# Patient Record
Sex: Male | Born: 1940 | Race: White | Hispanic: No | Marital: Married | Smoking: Former smoker
Health system: Southern US, Community
[De-identification: ages and names within clinical notes are randomized; demographics above are authoritative.]

## PROBLEM LIST (undated history)

## (undated) DIAGNOSIS — Z950 Presence of cardiac pacemaker: Secondary | ICD-10-CM

## (undated) DIAGNOSIS — I4891 Unspecified atrial fibrillation: Secondary | ICD-10-CM

## (undated) DIAGNOSIS — Z8719 Personal history of other diseases of the digestive system: Secondary | ICD-10-CM

## (undated) DIAGNOSIS — F0390 Unspecified dementia without behavioral disturbance: Secondary | ICD-10-CM

## (undated) DIAGNOSIS — I10 Essential (primary) hypertension: Secondary | ICD-10-CM

## (undated) DIAGNOSIS — C4490 Unspecified malignant neoplasm of skin, unspecified: Secondary | ICD-10-CM

## (undated) DIAGNOSIS — C61 Malignant neoplasm of prostate: Secondary | ICD-10-CM

## (undated) DIAGNOSIS — Z95810 Presence of automatic (implantable) cardiac defibrillator: Secondary | ICD-10-CM

## (undated) DIAGNOSIS — J189 Pneumonia, unspecified organism: Secondary | ICD-10-CM

## (undated) DIAGNOSIS — M1990 Unspecified osteoarthritis, unspecified site: Secondary | ICD-10-CM

## (undated) DIAGNOSIS — Z8711 Personal history of peptic ulcer disease: Secondary | ICD-10-CM

## (undated) DIAGNOSIS — I495 Sick sinus syndrome: Secondary | ICD-10-CM

## (undated) DIAGNOSIS — E785 Hyperlipidemia, unspecified: Secondary | ICD-10-CM

## (undated) HISTORY — PX: TRANSURETHRAL RESECTION OF PROSTATE: SHX73

## (undated) HISTORY — PX: LOOP RECORDER IMPLANT: SHX5954

## (undated) HISTORY — PX: EYE SURGERY: SHX253

## (undated) HISTORY — PX: FRACTURE SURGERY: SHX138

## (undated) HISTORY — PX: LOOP RECORDER REMOVAL: EP1215

## (undated) HISTORY — PX: HERNIA REPAIR: SHX51

## (undated) HISTORY — PX: CATARACT EXTRACTION, BILATERAL: SHX1313

## (undated) HISTORY — PX: RETINAL DETACHMENT SURGERY: SHX105

## (undated) HISTORY — PX: WRIST FRACTURE SURGERY: SHX121

## (undated) HISTORY — PX: INSERT / REPLACE / REMOVE PACEMAKER: SUR710

---

## 2018-02-27 ENCOUNTER — Inpatient Hospital Stay (HOSPITAL_COMMUNITY)
Admission: EM | Admit: 2018-02-27 | Discharge: 2018-03-01 | DRG: 193 | Disposition: A | Discharge status: Nursing Home (Includes ICF) | Payer: Medicare Other | Attending: Internal Medicine | Admitting: Internal Medicine

## 2018-02-27 ENCOUNTER — Emergency Department (HOSPITAL_COMMUNITY): Payer: Medicare Other

## 2018-02-27 ENCOUNTER — Encounter (HOSPITAL_COMMUNITY): Payer: Self-pay | Admitting: Emergency Medicine

## 2018-02-27 ENCOUNTER — Inpatient Hospital Stay (HOSPITAL_COMMUNITY): Payer: Medicare Other

## 2018-02-27 ENCOUNTER — Other Ambulatory Visit: Payer: Self-pay

## 2018-02-27 DIAGNOSIS — E876 Hypokalemia: Secondary | ICD-10-CM | POA: Diagnosis present

## 2018-02-27 DIAGNOSIS — I495 Sick sinus syndrome: Secondary | ICD-10-CM | POA: Diagnosis present

## 2018-02-27 DIAGNOSIS — I480 Paroxysmal atrial fibrillation: Secondary | ICD-10-CM | POA: Diagnosis not present

## 2018-02-27 DIAGNOSIS — G9341 Metabolic encephalopathy: Secondary | ICD-10-CM | POA: Diagnosis present

## 2018-02-27 DIAGNOSIS — Y95 Nosocomial condition: Secondary | ICD-10-CM | POA: Diagnosis present

## 2018-02-27 DIAGNOSIS — R0603 Acute respiratory distress: Secondary | ICD-10-CM | POA: Diagnosis present

## 2018-02-27 DIAGNOSIS — F0390 Unspecified dementia without behavioral disturbance: Secondary | ICD-10-CM | POA: Diagnosis present

## 2018-02-27 DIAGNOSIS — Z66 Do not resuscitate: Secondary | ICD-10-CM | POA: Diagnosis present

## 2018-02-27 DIAGNOSIS — E039 Hypothyroidism, unspecified: Secondary | ICD-10-CM | POA: Diagnosis present

## 2018-02-27 DIAGNOSIS — J181 Lobar pneumonia, unspecified organism: Secondary | ICD-10-CM | POA: Diagnosis present

## 2018-02-27 DIAGNOSIS — I10 Essential (primary) hypertension: Secondary | ICD-10-CM | POA: Diagnosis present

## 2018-02-27 DIAGNOSIS — E785 Hyperlipidemia, unspecified: Secondary | ICD-10-CM | POA: Diagnosis present

## 2018-02-27 DIAGNOSIS — Z87891 Personal history of nicotine dependence: Secondary | ICD-10-CM

## 2018-02-27 DIAGNOSIS — F0281 Dementia in other diseases classified elsewhere with behavioral disturbance: Secondary | ICD-10-CM | POA: Diagnosis not present

## 2018-02-27 DIAGNOSIS — Z950 Presence of cardiac pacemaker: Secondary | ICD-10-CM

## 2018-02-27 DIAGNOSIS — G309 Alzheimer's disease, unspecified: Secondary | ICD-10-CM | POA: Diagnosis not present

## 2018-02-27 DIAGNOSIS — J189 Pneumonia, unspecified organism: Secondary | ICD-10-CM | POA: Diagnosis present

## 2018-02-27 HISTORY — DX: Presence of automatic (implantable) cardiac defibrillator: Z95.810

## 2018-02-27 HISTORY — DX: Personal history of other diseases of the digestive system: Z87.19

## 2018-02-27 HISTORY — DX: Unspecified malignant neoplasm of skin, unspecified: C44.90

## 2018-02-27 HISTORY — DX: Unspecified dementia, unspecified severity, without behavioral disturbance, psychotic disturbance, mood disturbance, and anxiety: F03.90

## 2018-02-27 HISTORY — DX: Personal history of peptic ulcer disease: Z87.11

## 2018-02-27 HISTORY — DX: Unspecified atrial fibrillation: I48.91

## 2018-02-27 HISTORY — DX: Essential (primary) hypertension: I10

## 2018-02-27 HISTORY — DX: Malignant neoplasm of prostate: C61

## 2018-02-27 HISTORY — DX: Pneumonia, unspecified organism: J18.9

## 2018-02-27 HISTORY — DX: Sick sinus syndrome: I49.5

## 2018-02-27 HISTORY — DX: Hyperlipidemia, unspecified: E78.5

## 2018-02-27 HISTORY — DX: Presence of cardiac pacemaker: Z95.0

## 2018-02-27 HISTORY — DX: Unspecified osteoarthritis, unspecified site: M19.90

## 2018-02-27 LAB — PROTIME-INR
INR: 1.17
Prothrombin Time: 14.8 seconds (ref 11.4–15.2)

## 2018-02-27 LAB — URINALYSIS, ROUTINE W REFLEX MICROSCOPIC
Bilirubin Urine: NEGATIVE
Glucose, UA: NEGATIVE mg/dL
Hgb urine dipstick: NEGATIVE
Ketones, ur: NEGATIVE mg/dL
LEUKOCYTES UA: NEGATIVE
NITRITE: NEGATIVE
PH: 7 (ref 5.0–8.0)
Protein, ur: NEGATIVE mg/dL
SPECIFIC GRAVITY, URINE: 1.017 (ref 1.005–1.030)

## 2018-02-27 LAB — INFLUENZA PANEL BY PCR (TYPE A & B)
Influenza A By PCR: NEGATIVE
Influenza B By PCR: NEGATIVE

## 2018-02-27 LAB — I-STAT TROPONIN, ED: TROPONIN I, POC: 0 ng/mL (ref 0.00–0.08)

## 2018-02-27 LAB — CBC WITH DIFFERENTIAL/PLATELET
BASOS PCT: 0 %
Basophils Absolute: 0 10*3/uL (ref 0.0–0.1)
EOS ABS: 0 10*3/uL (ref 0.0–0.7)
EOS PCT: 0 %
HCT: 36.4 % — ABNORMAL LOW (ref 39.0–52.0)
Hemoglobin: 12.5 g/dL — ABNORMAL LOW (ref 13.0–17.0)
LYMPHS ABS: 0.7 10*3/uL (ref 0.7–4.0)
Lymphocytes Relative: 7 %
MCH: 30.1 pg (ref 26.0–34.0)
MCHC: 34.3 g/dL (ref 30.0–36.0)
MCV: 87.7 fL (ref 78.0–100.0)
Monocytes Absolute: 0.6 10*3/uL (ref 0.1–1.0)
Monocytes Relative: 6 %
Neutro Abs: 9.4 10*3/uL — ABNORMAL HIGH (ref 1.7–7.7)
Neutrophils Relative %: 87 %
PLATELETS: 204 10*3/uL (ref 150–400)
RBC: 4.15 MIL/uL — AB (ref 4.22–5.81)
RDW: 12.9 % (ref 11.5–15.5)
WBC: 10.8 10*3/uL — AB (ref 4.0–10.5)

## 2018-02-27 LAB — COMPREHENSIVE METABOLIC PANEL
ALBUMIN: 3.4 g/dL — AB (ref 3.5–5.0)
ALT: 16 U/L — ABNORMAL LOW (ref 17–63)
ANION GAP: 13 (ref 5–15)
AST: 29 U/L (ref 15–41)
Alkaline Phosphatase: 62 U/L (ref 38–126)
BUN: 18 mg/dL (ref 6–20)
CHLORIDE: 100 mmol/L — AB (ref 101–111)
CO2: 23 mmol/L (ref 22–32)
Calcium: 9 mg/dL (ref 8.9–10.3)
Creatinine, Ser: 1.03 mg/dL (ref 0.61–1.24)
GFR calc non Af Amer: >60 mL/min (ref >60)
GLUCOSE: 130 mg/dL — AB (ref 65–99)
Potassium: 3.4 mmol/L — ABNORMAL LOW (ref 3.5–5.1)
SODIUM: 136 mmol/L (ref 135–145)
Total Bilirubin: 0.6 mg/dL (ref 0.3–1.2)
Total Protein: 6.3 g/dL — ABNORMAL LOW (ref 6.5–8.1)

## 2018-02-27 LAB — HIV ANTIBODY (ROUTINE TESTING W REFLEX): HIV SCREEN 4TH GENERATION: NONREACTIVE

## 2018-02-27 LAB — I-STAT CG4 LACTIC ACID, ED
LACTIC ACID, VENOUS: 1.69 mmol/L (ref 0.5–1.9)
LACTIC ACID, VENOUS: 0.86 mmol/L (ref 0.5–1.9)

## 2018-02-27 LAB — BRAIN NATRIURETIC PEPTIDE: B Natriuretic Peptide: 37.3 pg/mL (ref 0.0–100.0)

## 2018-02-27 LAB — STREP PNEUMONIAE URINARY ANTIGEN: STREP PNEUMO URINARY ANTIGEN: NEGATIVE

## 2018-02-27 MED ORDER — SODIUM CHLORIDE 0.9 % IV BOLUS (SEPSIS)
500.0000 mL | Freq: Once | INTRAVENOUS | Status: AC
  Administered 2018-02-27: 500 mL via INTRAVENOUS

## 2018-02-27 MED ORDER — PIPERACILLIN-TAZOBACTAM 3.375 G IVPB 30 MIN
3.3750 g | Freq: Once | INTRAVENOUS | Status: AC
  Administered 2018-02-27: 3.375 g via INTRAVENOUS
  Filled 2018-02-27: qty 50

## 2018-02-27 MED ORDER — VANCOMYCIN HCL IN DEXTROSE 750-5 MG/150ML-% IV SOLN
750.0000 mg | Freq: Two times a day (BID) | INTRAVENOUS | Status: DC
  Administered 2018-02-27 – 2018-02-28 (×2): 750 mg via INTRAVENOUS
  Filled 2018-02-27 (×4): qty 150

## 2018-02-27 MED ORDER — RISPERIDONE 2 MG PO TABS
2.0000 mg | ORAL_TABLET | Freq: Two times a day (BID) | ORAL | Status: DC
  Administered 2018-02-27 – 2018-03-01 (×4): 2 mg via ORAL
  Filled 2018-02-27 (×6): qty 1

## 2018-02-27 MED ORDER — APIXABAN 5 MG PO TABS
5.0000 mg | ORAL_TABLET | Freq: Two times a day (BID) | ORAL | Status: DC
  Administered 2018-02-27 – 2018-03-01 (×5): 5 mg via ORAL
  Filled 2018-02-27 (×8): qty 1

## 2018-02-27 MED ORDER — SODIUM CHLORIDE 0.9 % IV BOLUS (SEPSIS)
1000.0000 mL | Freq: Once | INTRAVENOUS | Status: AC
  Administered 2018-02-27: 1000 mL via INTRAVENOUS

## 2018-02-27 MED ORDER — ACETAMINOPHEN 650 MG RE SUPP
650.0000 mg | Freq: Once | RECTAL | Status: AC
  Administered 2018-02-27: 650 mg via RECTAL
  Filled 2018-02-27: qty 1

## 2018-02-27 MED ORDER — SODIUM CHLORIDE 0.9 % IV SOLN
1.0000 g | Freq: Three times a day (TID) | INTRAVENOUS | Status: DC
  Administered 2018-02-27 – 2018-02-28 (×4): 1 g via INTRAVENOUS
  Filled 2018-02-27 (×7): qty 1

## 2018-02-27 MED ORDER — LEVOTHYROXINE SODIUM 100 MCG IV SOLR
22.0000 ug | Freq: Every day | INTRAVENOUS | Status: DC
  Administered 2018-02-28: 22 ug via INTRAVENOUS
  Filled 2018-02-27 (×2): qty 5

## 2018-02-27 MED ORDER — ACETAMINOPHEN 325 MG PO TABS: 650.0000 mg | ORAL_TABLET | Freq: Four times a day (QID) | ORAL | Status: DC | PRN

## 2018-02-27 MED ORDER — VANCOMYCIN HCL 10 G IV SOLR
1500.0000 mg | Freq: Once | INTRAVENOUS | Status: AC
  Administered 2018-02-27: 1500 mg via INTRAVENOUS
  Filled 2018-02-27: qty 1500

## 2018-02-27 MED ORDER — ACETAMINOPHEN 650 MG RE SUPP: 650.0000 mg | Freq: Four times a day (QID) | RECTAL | Status: DC | PRN

## 2018-02-27 MED ORDER — LEVOTHYROXINE SODIUM 100 MCG IV SOLR
22.0000 ug | Freq: Every day | INTRAVENOUS | Status: DC
  Filled 2018-02-27: qty 5

## 2018-02-27 MED ORDER — IPRATROPIUM-ALBUTEROL 0.5-2.5 (3) MG/3ML IN SOLN
3.0000 mL | Freq: Once | RESPIRATORY_TRACT | Status: AC
  Administered 2018-02-27: 3 mL via RESPIRATORY_TRACT
  Filled 2018-02-27: qty 3

## 2018-02-27 MED ORDER — VANCOMYCIN HCL IN DEXTROSE 1-5 GM/200ML-% IV SOLN: 1000.0000 mg | Freq: Once | INTRAVENOUS | Status: DC

## 2018-02-27 MED ORDER — SODIUM CHLORIDE 0.9 % IV SOLN
INTRAVENOUS | Status: DC
  Administered 2018-02-27 – 2018-02-28 (×5): via INTRAVENOUS

## 2018-02-27 NOTE — Progress Notes (Signed)
Pharmacy Antibiotic Note  Zachary Vasquez is a 77 y.o. male admitted on 02/27/2018 with pneumonia.  Pharmacy has been consulted for Vancomycin/Cefepime dosing. From nursing facility with fever, confusion, and lethargy. WBC 10.8. Renal function OK.   Plan: Vancomycin 1500 mg IV x 1 load already given in the ED, then give 750 mg IV q12h Cefepime 1g IV q8h Trend WBC, temp, renal function  F/U infectious work-up Drug levels as indicated   Height: 6' (182.9 cm) Weight: 180 lb (81.6 kg) IBW/kg (Calculated) : 77.6  Temp (24hrs), Avg:104.4 F (40.2 C), Min:104.4 F (40.2 C), Max:104.4 F (40.2 C)  Recent Labs  Lab 02/27/18 0129 02/27/18 0144  WBC 10.8*  --   CREATININE 1.03  --   LATICACIDVEN  --  1.69    Estimated Creatinine Clearance: 68 mL/min (by C-G formula based on SCr of 1.03 mg/dL).    Allergies no known allergies   Zachary Vasquez 02/27/2018 2:59 AM

## 2018-02-27 NOTE — ED Notes (Signed)
Pharmacy notified to adjust meds as they were not available.

## 2018-02-27 NOTE — ED Notes (Addendum)
Ordered pt's lunch tray ([employer]).

## 2018-02-27 NOTE — Progress Notes (Addendum)
Patient was admitted this morning and was seen by Dr. Alcario Drought. Patient is a 77 year old male with past medical history of paroxysmal atrial fibrillation, status post pacemaker placement, advanced dementia, nursing home resident who presents to the emergency department with 3-4-day history of fever and lethargy.  Patient progressively became more lethargic and confused at nursing home. He was found to have fever on presentation .Chest x-ray showed right lower lobe pneumonia and a small right pleural effusion. Patient was admitted for the management of healthcare associated pneumonia.  Started on broad-spectrum antibiotic with cefepime and vancomycin.  Cultures were sent.  Started on antibiotics. Patient seen and examined at the bedside .Wife was on the bedside.  Currently he looked comfortable.  He was not in any respiratory distress.  He was sleeping. He is hemodynamically stable. We will continue to monitor the patient.

## 2018-02-27 NOTE — ED Notes (Signed)
Pharmacy sent message about meds

## 2018-02-27 NOTE — ED Notes (Signed)
Code sepsis activated RN Mortimer Fries aware

## 2018-02-27 NOTE — ED Notes (Signed)
Pt's lunch tray arrived. 

## 2018-02-27 NOTE — ED Notes (Signed)
Patient placed on a monitor and pulse oximetry , NS IV bolus and Zosyn IV infusing , IV sites intact , Blood cultures collected prior to antibiotic infusion . Family at bedside , respirations unlabored .

## 2018-02-27 NOTE — ED Notes (Signed)
Report attempted x 1

## 2018-02-27 NOTE — ED Notes (Signed)
Visitor offered food, denies at this time.

## 2018-02-27 NOTE — H&P (Signed)
History and Physical    Zachary Vasquez MGN:[id number] DOB: May 22, 1941 DOA: 02/27/2018  PCP: [name], MD  Patient coming from: [facility] have personally briefly reviewed patient's old medical records in [facility]  Chief Complaint: Fever  HPI: Zachary Vasquez is a 77 y.o. male with medical history significant of PAF, SSS s/p PPM, dementia in SNF.  Patient presents to the ED with 3-4 day h/o fever and lethargy.  Family reports slowly declining mental status over that time period.  Doesn't talk much at baseline but has been even less responsive over past couple of days.  Has productive cough, no known injuries or falls, no vomiting nor diarrhea.   ED Course: Tm 104.4 rectally on presentation.  BP 118/50, HR 87 in NSR.  Satting 89% on RA, put on [place].  WBC 10.8k.  CXR demonstrates RLL PNA and small R pleural effusion.   Review of Systems: As per HPI otherwise 10 point review of systems negative.   Past Medical History:  Diagnosis Date  . A-fib ([place])   . Dementia   . HLD (hyperlipidemia)   . HTN (hypertension)   . SSS (sick sinus syndrome) ([facility])    s/p PPM    Past Surgical History:  Procedure Laterality Date  . HERNIA REPAIR    . PACEMAKER IMPLANT    . RETINAL DETACHMENT SURGERY       reports that he has quit smoking. He does not have any smokeless tobacco history on file. He reports that he drinks alcohol. He reports that he does not use drugs.  Allergies no known allergies  Family History  Problem Relation Age of Onset  . Alzheimer's disease Mother   . Lung cancer Father      Prior to Admission medications   Not on File    Physical Exam: Vitals:   02/27/18 0145 02/27/18 0200 02/27/18 0215 02/27/18 0230  BP: (!) 141/72 107/81 (!) 159/60 (!) 163/84  Pulse: (!) 116 (!) 121 (!) 59 (!) 114  Resp: 16 18 18 15   Temp:      TempSrc:      SpO2: 93% 95% 96% 97%  Weight:      Height:        Constitutional: NAD, calm, comfortable Eyes:  PERRL, lids and conjunctivae normal ENMT: Mucous membranes are moist. Posterior pharynx clear of any exudate or lesions.Normal dentition.  Neck: normal, supple, no masses, no thyromegaly Respiratory: Rales at base Cardiovascular: Regular rate and rhythm, no murmurs / rubs / gallops. No extremity edema. 2+ pedal pulses. No carotid bruits.  Abdomen: no tenderness, no masses palpated. No hepatosplenomegaly. Bowel sounds positive.  Musculoskeletal: no clubbing / cyanosis. No joint deformity upper and lower extremities. Good ROM, no contractures. Normal muscle tone.  Skin: no rashes, lesions, ulcers. No induration Neurologic: MAE, spontaneous eye opening, not answering questions nor following commands, intermittent tremor of L shoulder.  Definitely responsive to noxious stimuli so dont think patient in status epilepticus.   Labs on Admission: I have personally reviewed following labs and imaging studies  CBC: Recent Labs  Lab 02/27/18 0129  WBC 10.8*  NEUTROABS 9.4*  HGB 12.5*  HCT 36.4*  MCV 87.7  PLT 916   Basic Metabolic Panel: Recent Labs  Lab 02/27/18 0129  NA 136  K 3.4*  CL 100*  CO2 23  GLUCOSE 130*  BUN 18  CREATININE 1.03  CALCIUM 9.0   GFR: Estimated Creatinine Clearance: 68 mL/min (by C-G formula based on SCr  of 1.03 mg/dL). Liver Function Tests: Recent Labs  Lab 02/27/18 0129  AST 29  ALT 16*  ALKPHOS 62  BILITOT 0.6  PROT 6.3*  ALBUMIN 3.4*   No results for input(s): LIPASE, AMYLASE in the last 168 hours. No results for input(s): AMMONIA in the last 168 hours. Coagulation Profile: Recent Labs  Lab 02/27/18 0129  INR 1.17   Cardiac Enzymes: No results for input(s): CKTOTAL, CKMB, CKMBINDEX, TROPONINI in the last 168 hours. BNP (last 3 results) No results for input(s): PROBNP in the last 8760 hours. HbA1C: No results for input(s): HGBA1C in the last 72 hours. CBG: No results for input(s): GLUCAP in the last 168 hours. Lipid Profile: No results  for input(s): CHOL, HDL, LDLCALC, TRIG, CHOLHDL, LDLDIRECT in the last 72 hours. Thyroid Function Tests: No results for input(s): TSH, T4TOTAL, FREET4, T3FREE, THYROIDAB in the last 72 hours. Anemia Panel: No results for input(s): VITAMINB12, FOLATE, FERRITIN, TIBC, IRON, RETICCTPCT in the last 72 hours. Urine analysis:    Component Value Date/Time   COLORURINE YELLOW 02/27/2018 0203   APPEARANCEUR CLEAR 02/27/2018 0203   LABSPEC 1.017 02/27/2018 0203   PHURINE 7.0 02/27/2018 0203   GLUCOSEU NEGATIVE 02/27/2018 0203   HGBUR NEGATIVE 02/27/2018 0203   BILIRUBINUR NEGATIVE 02/27/2018 0203   KETONESUR NEGATIVE 02/27/2018 0203   PROTEINUR NEGATIVE 02/27/2018 0203   NITRITE NEGATIVE 02/27/2018 0203   LEUKOCYTESUR NEGATIVE 02/27/2018 0203    Radiological Exams on Admission: Dg Chest Portable 1 View  Result Date: 02/27/2018 CLINICAL DATA:  Fever for 2 days. EXAM: PORTABLE CHEST 1 VIEW COMPARISON:  None. FINDINGS: RIGHT lower lobe airspace opacity. Small RIGHT pleural effusion. Cardiomediastinal silhouette is normal given rotation to the RIGHT, calcified aortic arch. No pneumothorax. Dual lead LEFT cardiac pacemaker with lead tips projecting RIGHT atrium and RIGHT ventricle. No pneumothorax. Osteopenia. IMPRESSION: RIGHT lower lobe airspace opacity concerning for pneumonia with small RIGHT pleural effusion. Followup PA and lateral chest X-ray is recommended in 3-4 weeks following trial of antibiotic therapy to ensure resolution and exclude underlying malignancy. Aortic Atherosclerosis (ICD10-I70.0). Electronically Signed   By: Elon Alas M.D.   On: 02/27/2018 01:50    EKG: Independently reviewed.  Assessment/Plan Principal Problem:   HCAP (healthcare-associated pneumonia) Active Problems:   Dementia   PAF (paroxysmal atrial fibrillation) ([place])    1. HCAP - 1. PNA pathway 2. Cefepime and vanc 3. Cultures pending 4. IVF: NS at 100 (bolus in ED) 5. O2 via [place] 2. PAF  - 1. Currently NSR 2. Will leave on eliquis when taking POs 3. Tele monitor 3. Dementia - continue home meds  DVT prophylaxis: Continue eliquis Code Status: DNR/DNI per family at bedside Family Communication: Family at bedside Disposition Plan: SNF after admit Consults called: None Admission status: Admit to inpatient   Etta Quill DO [facility] Pager [phone]  If 7AM-7PM, please contact day team taking care of patient [email or web address] Password [email or web address]  02/27/2018, 3:10 AM

## 2018-02-27 NOTE — ED Notes (Addendum)
Pt tolerating diet coke and meal.

## 2018-02-27 NOTE — ED Triage Notes (Signed)
Patient arrived with EMS from [facility] home staff reported fever and confusion / lethargy with poor appetite onset this week .

## 2018-02-27 NOTE — ED Provider Notes (Signed)
TIME SEEN: 1:47 AM  CHIEF COMPLAINT: Fever  HPI: Patient is a 77 year old male with history of dementia, atrial fibrillation who lives in a nursing home who presents to the emergency department with 3-4 days of fever and lethargy.  Family reports slowly declining mental status.  They report that he does not talk much at baseline but has been less responsive.  No known injuries, falls.  He has had productive cough.  No known vomiting or diarrhea.  Unsure if he has had a flu vaccination.  ROS: Level 5 caveat secondary to dementia  PAST MEDICAL HISTORY/PAST SURGICAL HISTORY:  Past Medical History:  Diagnosis Date  . A-fib ([place])   . Dementia     MEDICATIONS:  Prior to Admission medications   Not on File    ALLERGIES:  Allergies no known allergies  SOCIAL HISTORY:  Social History   Tobacco Use  . Smoking status: Unknown If Ever Smoked  Substance Use Topics  . Alcohol use: Not on file    FAMILY HISTORY: No family history on file.  EXAM: BP 131/74 (BP Location: Left Arm)   Pulse (!) 122   Temp (!) 104.4 F (40.2 C) (Rectal)   Resp 20   Ht 6' (1.829 m)   Wt 81.6 kg (180 lb)   SpO2 91%   BMI 24.41 kg/m  CONSTITUTIONAL: Alert but does not answer questions or follow commands.  He does open his eyes and move all extremities spontaneously.  He is elderly and chronically ill-appearing. HEAD: Normocephalic EYES: Conjunctivae clear, pupils appear equal, EOMI ENT: normal nose; moist mucous membranes NECK: Supple, no meningismus, no nuchal rigidity, no LAD  CARD: Regular and tachycardic; S1 and S2 appreciated; no murmurs, no clicks, no rubs, no gallops RESP: Normal chest excursion without splinting or tachypnea; breath sounds equal bilaterally, scattered expiratory wheezes and rhonchi heard on exam, no rales, patient sats 89% on room air, respiratory distress, speaking full sentences ABD/GI: Normal bowel sounds; non-distended; soft, non-tender, no rebound, no guarding, no  peritoneal signs, no hepatosplenomegaly BACK:  The back appears normal and is non-tender to palpation, there is no CVA tenderness EXT: Normal ROM in all joints; non-tender to palpation; no edema; normal capillary refill; no cyanosis, no calf tenderness or swelling    SKIN: Normal color for age and race; warm; no rash NEURO: Moves all extremities equally, does not answer questions or follows commands, opens eyes spontaneously  MEDICAL DECISION MAKING: Patient here with fever, tachycardia.  He is normotensive.  We will start septic workup.  Clinically concern for pneumonia given the way his lungs sound.  He is satting 89% on room air.  Tylenol reports that he does wear oxygen intermittently.  Will place him on oxygen in the ED.  We will start IV fluids and broad-spectrum antibiotics, obtain labs and cultures, urine and chest x-ray.  I feel he will need admission.  Differential also includes UTI, bacteremia.  ED PROGRESS: Chest x-ray concerning for right lower lobe pneumonia.  White count of 10.8 with left shift.  Normal lactate.  Flu swab pending.  Cultures pending.  Urine shows no sign of infection.  Will discuss with hospitalist for admission.   2:57 AM Discussed patient's case with hospitalist, Dr. Alcario Drought.  I have recommended admission and patient (and family if present) agree with this plan. Admitting physician will place admission orders.   I reviewed all nursing notes, vitals, pertinent previous records, EKGs, lab and urine results, imaging (as available).     EKG Interpretation  Date/Time:  Tuesday February 27 2018 01:18:07 EDT Ventricular Rate:  125 PR Interval:    QRS Duration: 84 QT Interval:  274 QTC Calculation: 394 R Axis:   -131 Text Interpretation:  Sinus tachycardia Low voltage, extremity and precordial leads Baseline wander in lead(s) V6 No old tracing to compare Confirmed by Malaijah Houchen, Cyril Mourning [phone]) on 02/27/2018 1:47:23 AM        CRITICAL CARE Performed by: Cyril Mourning  Joshia Kitchings   Total critical care time: 45 minutes  Critical care time was exclusive of separately billable procedures and treating other patients.  Critical care was necessary to treat or prevent imminent or life-threatening deterioration.  Critical care was time spent personally by me on the following activities: development of treatment plan with patient and/or surrogate as well as nursing, discussions with consultants, evaluation of patient's response to treatment, examination of patient, obtaining history from patient or surrogate, ordering and performing treatments and interventions, ordering and review of laboratory studies, ordering and review of radiographic studies, pulse oximetry and re-evaluation of patient's condition.      Giannah Zavadil, Delice Bison, DO 02/27/18 [phone]

## 2018-02-27 NOTE — ED Notes (Signed)
Pharmacist notified to send pt.'s cefepime 1g IV order.

## 2018-02-28 DIAGNOSIS — J189 Pneumonia, unspecified organism: Secondary | ICD-10-CM

## 2018-02-28 LAB — BASIC METABOLIC PANEL
ANION GAP: 7 (ref 5–15)
BUN: 10 mg/dL (ref 6–20)
CALCIUM: 8.4 mg/dL — AB (ref 8.9–10.3)
CO2: 24 mmol/L (ref 22–32)
Chloride: 104 mmol/L (ref 101–111)
Creatinine, Ser: 0.81 mg/dL (ref 0.61–1.24)
GLUCOSE: 103 mg/dL — AB (ref 65–99)
Potassium: 3 mmol/L — ABNORMAL LOW (ref 3.5–5.1)
Sodium: 135 mmol/L (ref 135–145)

## 2018-02-28 LAB — CBC WITH DIFFERENTIAL/PLATELET
BASOS ABS: 0 10*3/uL (ref 0.0–0.1)
Basophils Relative: 0 %
EOS PCT: 1 %
Eosinophils Absolute: 0.1 10*3/uL (ref 0.0–0.7)
HCT: 30.6 % — ABNORMAL LOW (ref 39.0–52.0)
Hemoglobin: 10.1 g/dL — ABNORMAL LOW (ref 13.0–17.0)
Lymphocytes Relative: 17 %
Lymphs Abs: 1.4 10*3/uL (ref 0.7–4.0)
MCH: 28.9 pg (ref 26.0–34.0)
MCHC: 33 g/dL (ref 30.0–36.0)
MCV: 87.4 fL (ref 78.0–100.0)
MONO ABS: 0.6 10*3/uL (ref 0.1–1.0)
Monocytes Relative: 7 %
Neutro Abs: 6.2 10*3/uL (ref 1.7–7.7)
Neutrophils Relative %: 75 %
PLATELETS: 156 10*3/uL (ref 150–400)
RBC: 3.5 MIL/uL — ABNORMAL LOW (ref 4.22–5.81)
RDW: 12.7 % (ref 11.5–15.5)
WBC: 8.3 10*3/uL (ref 4.0–10.5)

## 2018-02-28 LAB — URINE CULTURE: Culture: NO GROWTH

## 2018-02-28 MED ORDER — VANCOMYCIN HCL IN DEXTROSE 1-5 GM/200ML-% IV SOLN
1000.0000 mg | Freq: Two times a day (BID) | INTRAVENOUS | Status: DC
  Filled 2018-02-28: qty 200

## 2018-02-28 MED ORDER — IPRATROPIUM-ALBUTEROL 0.5-2.5 (3) MG/3ML IN SOLN: 3.0000 mL | Freq: Four times a day (QID) | RESPIRATORY_TRACT | Status: DC | PRN

## 2018-02-28 MED ORDER — POTASSIUM CHLORIDE CRYS ER 20 MEQ PO TBCR
40.0000 meq | EXTENDED_RELEASE_TABLET | Freq: Once | ORAL | Status: AC
  Administered 2018-02-28: 40 meq via ORAL
  Filled 2018-02-28: qty 2

## 2018-02-28 MED ORDER — PIPERACILLIN-TAZOBACTAM 3.375 G IVPB
3.3750 g | Freq: Three times a day (TID) | INTRAVENOUS | Status: DC
  Administered 2018-02-28 – 2018-03-01 (×3): 3.375 g via INTRAVENOUS
  Filled 2018-02-28 (×5): qty 50

## 2018-02-28 MED ORDER — IPRATROPIUM-ALBUTEROL 0.5-2.5 (3) MG/3ML IN SOLN
3.0000 mL | Freq: Four times a day (QID) | RESPIRATORY_TRACT | Status: DC
  Filled 2018-02-28: qty 3
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
## 2018-02-28 NOTE — Progress Notes (Signed)
Pharmacy Antibiotic Note  Zachary Vasquez is a 77 y.o. male admitted on 02/27/2018 with pneumonia.  Pharmacy has been consulted for Vancomycin/Cefepime dosing.   The patient's renal function has improved slightly warranting a dose adjustment. SCr 0.81, CrCl~70-80 ml/min.   Plan: - Increase Vancomycin to 1g IV every 12 hours - Continue Cefepime 1g IV every 8 hours - Will continue to follow renal function, culture results, LOT, and antibiotic de-escalation plans   Height: 6' (182.9 cm) Weight: 180 lb (81.6 kg) IBW/kg (Calculated) : 77.6  Temp (24hrs), Avg:98.1 F (36.7 C), Min:97.5 F (36.4 C), Max:98.6 F (37 C)  Recent Labs  Lab 02/27/18 0129 02/27/18 0144 02/27/18 0319 02/28/18 0251  WBC 10.8*  --   --  8.3  CREATININE 1.03  --   --  0.81  LATICACIDVEN  --  1.69 0.86  --     Estimated Creatinine Clearance: 86.5 mL/min (by C-G formula based on SCr of 0.81 mg/dL).    No Known Allergies   Vanc 4/16 >> Cefepime 4/16 >>  4/16 BCx >> 4/16 RCx >> 4/16 UCx >>   Thank you for allowing pharmacy to be a part of this patient's care.  Alycia Rossetti, PharmD, BCPS Clinical Pharmacist Pager: [phone] Clinical phone for 02/28/2018 from 7a-3:30p: [phone] If after 3:30p, please call main pharmacy at: [phone] 02/28/2018 9:09 AM

## 2018-02-28 NOTE — Progress Notes (Signed)
Pharmacy Antibiotic Note  Zachary Vasquez is a 77 y.o. male admitted on 02/27/2018 with pneumonia.  Pharmacy has been consulted to transition the patient to Zosyn to cover for possible aspiration.  Plan: - Start Zosyn 3.375g IV every 8 hours (infused over 4 hours) - Will continue to follow renal function, culture results, LOT, and antibiotic de-escalation plans   Height: 6' (182.9 cm) Weight: 180 lb (81.6 kg) IBW/kg (Calculated) : 77.6  Temp (24hrs), Avg:98.1 F (36.7 C), Min:97.5 F (36.4 C), Max:98.6 F (37 C)  Recent Labs  Lab 02/27/18 0129 02/27/18 0144 02/27/18 0319 02/28/18 0251  WBC 10.8*  --   --  8.3  CREATININE 1.03  --   --  0.81  LATICACIDVEN  --  1.69 0.86  --     Estimated Creatinine Clearance: 86.5 mL/min (by C-G formula based on SCr of 0.81 mg/dL).    No Known Allergies   Vanc 4/16 >>4/17 Cefepime 4/16 >> 4/17 Zosyn 4/17 >>  4/16 BCx >> 4/16 RCx >> 4/16 UCx >>   Thank you for allowing pharmacy to be a part of this patient's care.  Alycia Rossetti, PharmD, BCPS Clinical Pharmacist Pager: [phone] Clinical phone for 02/28/2018 from 7a-3:30p: [phone] If after 3:30p, please call main pharmacy at: [phone] 02/28/2018 11:15 AM

## 2018-02-28 NOTE — Progress Notes (Signed)
PROGRESS NOTE    Zachary Vasquez  XHB:[id number] DOB: 1941/06/19 DOA: 02/27/2018 PCP: [name], MD   Brief Narrative:  77 year old male with history of paroxysmal atrial fibrillation, sick sinus syndrome has pacemaker, dementia lives in [place] comes to the hospital with complains of fevers and lethargy for 3-4 days.  Per family patient has been slowly declining over the course of last several days.  In the ER when the chest x-ray was done that showed he had right lower lobe pneumonia with small right-sided pleural effusion therefore he was admitted for the treatment of healthcare acquired pneumonia.  Initially patient was started on vancomycin and cefepime.   Assessment & Plan:   Principal Problem:   HCAP (healthcare-associated pneumonia) Active Problems:   Dementia   PAF (paroxysmal atrial fibrillation) (HCC)   Acute respiratory distress, improving Healthcare acquired pneumonia Acute metabolic encephalopathy, improved - At this time his cultures have remained negative.  I suspect he may have underlying aspiration issues therefore speech and swallow eval has been ordered -We will stop his vancomycin and cefepime.  Start patient on Zosyn -Incentive spirometry -Nebulizer treatments  History of paroxysmal atrial fibrillation - Currently he is in normal sinus rhythm -Continue home regimen of Eliquis - Closely monitor electrolytes  Hypokalemia -Potassium repletion ordered  Hypothyroidism -Continue home Synthroid   DVT prophylaxis: On Eliquis Code Status: DO NOT RESUSCITATE Family Communication: Wife at bedside Disposition Plan: Likely discharge in next 24 hours  Consultants:   None  Procedures:   None  Antimicrobials:   Vancomycin and cefepime 4/15> 4/17  Zosyn 4/17   Subjective: States his breathing is little better but this morning after breakfast patient was coughing a lot.  I spoke with the patient and the family at bedside concerning possible  aspiration.  Review of Systems Otherwise negative except as per HPI, including: General: Denies fever, chills, night sweats or unintended weight loss. Resp: Denies wheezing Cardiac: Denies chest pain, palpitations, orthopnea, paroxysmal nocturnal dyspnea. GI: Denies abdominal pain, nausea, vomiting, diarrhea or constipation GU: Denies dysuria, frequency, hesitancy or incontinence MS: Denies muscle aches, joint pain or swelling Neuro: Denies headache, neurologic deficits (focal weakness, numbness, tingling), abnormal gait Psych: Denies anxiety, depression, SI/HI/AVH Skin: Denies new rashes or lesions ID: Denies sick contacts, exotic exposures, travel  Objective: Vitals:   02/27/18 1656 02/27/18 1657 02/27/18 2350 02/28/18 0742  BP: (!) 149/76 (!) 149/76 140/75 (!) 155/77  Pulse: 68 68 (!) 59 60  Resp:   16 16  Temp: 98.6 F (37 C) 98.6 F (37 C) (!) 97.5 F (36.4 C) 97.8 F (36.6 C)  TempSrc: Axillary Oral Axillary Oral  SpO2: 92% 93%  96%  Weight:      Height:        Intake/Output Summary (Last 24 hours) at 02/28/2018 1102 Last data filed at 02/28/2018 0905 Gross per 24 hour  Intake 710 ml  Output 475 ml  Net 235 ml   Filed Weights   02/27/18 0114  Weight: 81.6 kg (180 lb)    Examination:  General exam: Appears calm and comfortable, elderly frail-appearing on 2 L nasal cannula Respiratory system: Diffuse diminished breath sounds with bibasilar crackles Cardiovascular system: S1 & S2 heard, RRR. No JVD, murmurs, rubs, gallops or clicks. No pedal edema. Gastrointestinal system: Abdomen is nondistended, soft and nontender. No organomegaly or masses felt. Normal bowel sounds heard. Central nervous system: Alert and oriented. No focal neurological deficits. Extremities: Symmetric 4 x 5 power. Skin: No rashes, lesions or ulcers Psychiatry:  Judgement and insight appear normal. Mood & affect appropriate.     Data Reviewed:   CBC: Recent Labs  Lab 02/27/18 0129  02/28/18 0251  WBC 10.8* 8.3  NEUTROABS 9.4* 6.2  HGB 12.5* 10.1*  HCT 36.4* 30.6*  MCV 87.7 87.4  PLT 204 664   Basic Metabolic Panel: Recent Labs  Lab 02/27/18 0129 02/28/18 0251  NA 136 135  K 3.4* 3.0*  CL 100* 104  CO2 23 24  GLUCOSE 130* 103*  BUN 18 10  CREATININE 1.03 0.81  CALCIUM 9.0 8.4*   GFR: Estimated Creatinine Clearance: 86.5 mL/min (by C-G formula based on SCr of 0.81 mg/dL). Liver Function Tests: Recent Labs  Lab 02/27/18 0129  AST 29  ALT 16*  ALKPHOS 62  BILITOT 0.6  PROT 6.3*  ALBUMIN 3.4*   No results for input(s): LIPASE, AMYLASE in the last 168 hours. No results for input(s): AMMONIA in the last 168 hours. Coagulation Profile: Recent Labs  Lab 02/27/18 0129  INR 1.17   Cardiac Enzymes: No results for input(s): CKTOTAL, CKMB, CKMBINDEX, TROPONINI in the last 168 hours. BNP (last 3 results) No results for input(s): PROBNP in the last 8760 hours. HbA1C: No results for input(s): HGBA1C in the last 72 hours. CBG: No results for input(s): GLUCAP in the last 168 hours. Lipid Profile: No results for input(s): CHOL, HDL, LDLCALC, TRIG, CHOLHDL, LDLDIRECT in the last 72 hours. Thyroid Function Tests: No results for input(s): TSH, T4TOTAL, FREET4, T3FREE, THYROIDAB in the last 72 hours. Anemia Panel: No results for input(s): VITAMINB12, FOLATE, FERRITIN, TIBC, IRON, RETICCTPCT in the last 72 hours. Sepsis Labs: Recent Labs  Lab 02/27/18 0144 02/27/18 0319  LATICACIDVEN 1.69 0.86    Recent Results (from the past 240 hour(s))  Urine culture     Status: None   Collection Time: 02/27/18  2:03 AM  Result Value Ref Range Status   Specimen Description URINE, RANDOM  Final   Special Requests NONE  Final   Culture   Final    NO GROWTH Performed at [facility], [address], [place], [place] [address]    Report Status 02/28/2018 FINAL  Final         Radiology Studies: Ct Head Wo Contrast  Result Date:  02/27/2018 CLINICAL DATA:  Altered level of consciousness. History of dementia, hypertension and atrial fibrillation. EXAM: CT HEAD WITHOUT CONTRAST TECHNIQUE: Contiguous axial images were obtained from the base of the skull through the vertex without intravenous contrast. COMPARISON:  None. FINDINGS: Mild motion degraded examination. BRAIN: No intraparenchymal hemorrhage, mass effect nor midline shift. Moderate parenchymal brain volume loss. No hydrocephalus. Confluent supratentorial white matter hypodensities. 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 No acute large vascular territory infarcts. No abnormal extra-axial fluid collections. Basal cisterns are patent. VASCULAR: Mild calcific atherosclerosis of the carotid siphons. SKULL: No skull fracture. Torus palatini. No significant scalp soft tissue swelling. SINUSES/ORBITS: The mastoid air-cells and included paranasal sinuses are well-aerated. Soft tissue LEFT external auditory canal most compatible with cerumen. Status post RIGHT ocular globe silicon injection and scleral banding. Status post LEFT ocular lens implant. OTHER: None. IMPRESSION: 1. Motion degraded examination. 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 2. Moderate parenchymal brain volume loss and moderate to severe chronic small vessel ischemic disease. Electronically Signed   By: Elon Alas M.D.   On: 02/27/2018 04:19   Dg Chest Portable 1 View  Result Date: 02/27/2018 CLINICAL DATA:  Fever for 2 days. EXAM: PORTABLE CHEST  1 VIEW COMPARISON:  None. FINDINGS: RIGHT lower lobe airspace opacity. Small RIGHT pleural effusion. Cardiomediastinal silhouette is normal given rotation to the RIGHT, calcified aortic arch. No pneumothorax. Dual lead LEFT cardiac pacemaker with lead tips projecting RIGHT atrium and RIGHT ventricle. No pneumothorax. Osteopenia. IMPRESSION: RIGHT lower lobe airspace opacity concerning for pneumonia with small RIGHT pleural effusion.  Followup PA and lateral chest X-ray is recommended in 3-4 weeks following trial of antibiotic therapy to ensure resolution and exclude underlying malignancy. Aortic Atherosclerosis (ICD10-I70.0). Electronically Signed   By: Elon Alas M.D.   On: 02/27/2018 01:50        Scheduled Meds: . apixaban  5 mg Oral BID  . levothyroxine  22 mcg Intravenous Daily  . risperiDONE  2 mg Oral BID   Continuous Infusions: . sodium chloride 100 mL/hr at 02/28/18 0628  . ceFEPime (MAXIPIME) IV Stopped (02/28/18 0710)  . vancomycin       LOS: 1 day    Time spent: 25 mins    Zachary Melichar Arsenio Loader, MD [facility] Pager [phone]   If 7PM-7AM, please contact night-coverage [email or web address] Password [email or web address] 02/28/2018, 11:02 AM

## 2018-03-01 LAB — BASIC METABOLIC PANEL
ANION GAP: 12 (ref 5–15)
BUN: 8 mg/dL (ref 6–20)
CALCIUM: 8.7 mg/dL — AB (ref 8.9–10.3)
CO2: 21 mmol/L — ABNORMAL LOW (ref 22–32)
Chloride: 102 mmol/L (ref 101–111)
Creatinine, Ser: 0.91 mg/dL (ref 0.61–1.24)
Glucose, Bld: 150 mg/dL — ABNORMAL HIGH (ref 65–99)
POTASSIUM: 2.9 mmol/L — AB (ref 3.5–5.1)
Sodium: 135 mmol/L (ref 135–145)

## 2018-03-01 LAB — MAGNESIUM: MAGNESIUM: 1.8 mg/dL (ref 1.7–2.4)

## 2018-03-01 MED ORDER — IPRATROPIUM-ALBUTEROL 0.5-2.5 (3) MG/3ML IN SOLN: 3.0000 mL | Freq: Four times a day (QID) | RESPIRATORY_TRACT | 0 refills | Status: DC | PRN

## 2018-03-01 MED ORDER — IPRATROPIUM-ALBUTEROL 0.5-2.5 (3) MG/3ML IN SOLN: 3.0000 mL | Freq: Four times a day (QID) | RESPIRATORY_TRACT | 0 refills | Status: AC | PRN

## 2018-03-01 MED ORDER — AMOXICILLIN-POT CLAVULANATE 875-125 MG PO TABS: 1.0000 | ORAL_TABLET | Freq: Two times a day (BID) | ORAL | 0 refills | Status: DC

## 2018-03-01 MED ORDER — LEVOTHYROXINE SODIUM 88 MCG PO TABS
88.0000 ug | ORAL_TABLET | Freq: Every day | ORAL | Status: DC
  Administered 2018-03-01: 88 ug via ORAL
  Filled 2018-03-01: qty 1

## 2018-03-01 MED ORDER — POTASSIUM CHLORIDE CRYS ER 20 MEQ PO TBCR
40.0000 meq | EXTENDED_RELEASE_TABLET | Freq: Once | ORAL | Status: AC
  Administered 2018-03-01: 40 meq via ORAL
  Filled 2018-03-01: qty 2

## 2018-03-01 MED ORDER — POTASSIUM CHLORIDE ER 10 MEQ PO TBCR: 40.0000 meq | EXTENDED_RELEASE_TABLET | Freq: Every day | ORAL | 0 refills | Status: DC

## 2018-03-01 NOTE — Clinical Social Work Note (Signed)
Clinical Social Work Assessment  Patient Details  Name: Zachary Vasquez MRN: [id number] Date of Birth: 12-01-1940  Date of referral:  03/01/18               Reason for consult:  Facility Placement                Permission sought to share information with:  Family Supports Permission granted to share information::  No(pt with Alzheimers- DOx4- spoke with next of kin)  Name::     Zachary Vasquez  Agency::  [facility]  Relationship::  wife  Contact Information:     Housing/Transportation Living arrangements for the past 2 months:  [place] of Information:  Spouse Patient Interpreter Needed:  None Criminal Activity/Legal Involvement Pertinent to Current Situation/Hospitalization:  No - Comment as needed Significant Relationships:  Adult Children, Spouse Lives with:  Facility Resident Do you feel safe going back to the place where you live?  Yes Need for family participation in patient care:  Yes (Comment)(decision making)  Care giving concerns:  Pt lives at [facility]- no concerns with care at ALF at this time.   Social Worker assessment / plan:  CSW spoke with pt wife concerning plan for time of DC.  Wife confirmed pt lives at [facility] (memory care).  Employment status:  Retired Nurse, adult PT Recommendations:  Not assessed at this time Information / Referral to community resources:     Patient/Family's Response to care:  Wife agreeable to pt return to ALF at DC.  Patient/Family's Understanding of and Emotional Response to Diagnosis, Current Treatment, and Prognosis:  Good understanding- realistic about pt needs and hopeful pt can return to ALF soon where he is comfortable.  Emotional Assessment Appearance:  Appears stated age Attitude/Demeanor/Rapport:  Unable to Assess Affect (typically observed):  Unable to Assess Orientation:    Alcohol / Substance use:  Not Applicable Psych involvement (Current and  /or in the community):  No (Comment)  Discharge Needs  Concerns to be addressed:  Care Coordination Readmission within the last 30 days:  No Current discharge risk:  None Barriers to Discharge:  No Barriers Identified   Jorge Ny, LCSW 03/01/2018, 10:31 AM

## 2018-03-01 NOTE — NC FL2 (Addendum)
  [place] LEVEL OF CARE SCREENING TOOL     IDENTIFICATION  Patient Name: Zachary Vasquez Birthdate: 07/04/1941 Sex: male Admission Date (Current Location): 02/27/2018  [facility] and [place] Number:  Herbalist and Address:  [facility], [place] [address] [place], [place] [address]      Provider Number: [id number]  Attending Physician Name and Address:  Damita Lack, MD  Relative Name and Phone Number:       Current Level of Care: Hospital Recommended Level of Care: [place] care Prior Approval Number:    Date Approved/Denied:   PASRR Number:    Discharge Plan: Other (Comment)(ALF)    Current Diagnoses: Patient Active Problem List   Diagnosis Date Noted  . HCAP (healthcare-associated pneumonia) 02/27/2018  . Dementia 02/27/2018  . PAF (paroxysmal atrial fibrillation) ([place]) 02/27/2018    Orientation RESPIRATION BLADDER Height & Weight        Normal Incontinent Weight: 180 lb (81.6 kg) Height:  6' (182.9 cm)  BEHAVIORAL SYMPTOMS/MOOD NEUROLOGICAL BOWEL NUTRITION STATUS      Continent Diet(regular)  AMBULATORY STATUS COMMUNICATION OF NEEDS Skin   Extensive Assist Verbally Normal                       Personal Care Assistance Level of Assistance  Bathing, Dressing Bathing Assistance: Limited assistance   Dressing Assistance: Limited assistance     Functional Limitations Info             SPECIAL CARE FACTORS FREQUENCY                       Contractures      Additional Factors Info  Code Status, Allergies Code Status Info: DNR Allergies Info: NKA              Discharge Medications: Medication List    TAKE these medications   amoxicillin-clavulanate 875-125 MG tablet Commonly known as:  AUGMENTIN Take 1 tablet by mouth every 12 (twelve) hours for 5 days.   ELIQUIS 5 MG Tabs tablet Generic drug:  apixaban Take 5 mg by mouth 2 (two) times daily.    furosemide 20 MG tablet Commonly known as:  LASIX Take 20 mg by mouth.   ipratropium-albuterol 0.5-2.5 (3) MG/3ML Soln Commonly known as:  DUONEB Take 3 mLs by nebulization every 6 (six) hours as needed.   levothyroxine 88 MCG tablet Commonly known as:  SYNTHROID, LEVOTHROID Take 88 mcg by mouth daily before breakfast.   potassium chloride 10 MEQ tablet Commonly known as:  K-DUR Take 4 tablets (40 mEq total) by mouth daily for 2 doses. Start taking on:  03/02/2018      Relevant Imaging Results:  Relevant Lab Results:   Additional Information    Jorge Ny, LCSW

## 2018-03-01 NOTE — Discharge Summary (Addendum)
Physician Discharge Summary  Dannon Nguyenthi MGQ:[id number] DOB: 1941-05-05 DOA: 02/27/2018  PCP: [name], MD  Admit date: 02/27/2018 Discharge date: 03/01/2018  Admitted From: Return to assisted living-[facility] Disposition: Return to assisted living-[facility]  Recommendations for Outpatient Follow-up:  1. Follow up with PCP in 1-2 weeks 2. Take Augmentin for 5 days, due to Low potassium on the day of discharge therefore will give him a dose of 43meQ dose here and send him on 2 more doses of it at home. He can get repeat lab work done at his PCPs visit.  3. Nebulizer treatments as needed for shortness of breath.    Discharge Condition: Stable CODE STATUS: DNR Diet recommendation: Regular  Brief/Interim Summary: 77 year old male with history of paroxysmal atrial fibrillation, sick sinus syndrome has pacemaker, dementia lives in [facility] comes to the hospital with complains of fevers and lethargy for 3-4 days.  Per family patient has been slowly declining over the course of last several days.  In the ER when the chest x-ray was done that showed he had right lower lobe pneumonia with small right-sided pleural effusion therefore he was admitted for the treatment of healthcare acquired pneumonia.  Initially patient was started on vancomycin and cefepime, which was later switched to Zosyn due to concerns of aspiration.  Patient has speech and swallow evaluation done which she passed and did not show any signs of aspiration.  Despite of this have alerted the family that he could still have silent aspiration therefore should always use caution. Today patient is deemed medically stable and off oxygen.  He feels much better therefore we will discharge him in medically stable condition back to his assisted living facility.  He will complete 5-day oral Augmentin course and use nebulizer treatments as necessary.  No complaints this morning.  Wife and the patient states he feels much  better and is ready to go back.  HEENT/EYES = negative for pain, redness, loss of vision, double vision, blurred vision, loss of hearing, sore throat, hoarseness, dysphagia Cardiovascular= negative for chest pain, palpitation, murmurs, lower extremity swelling Respiratory/lungs= negative for shortness of breath, cough, hemoptysis, wheezing, mucus production Gastrointestinal= negative for nausea, vomiting,, abdominal pain, melena, hematemesis Genitourinary= negative for Dysuria, Hematuria, Change in Urinary Frequency MSK = Negative for arthralgia, myalgias, Back Pain, Joint swelling  Neurology= Negative for headache, seizures, numbness, tingling  Psychiatry= Negative for anxiety, depression, suicidal and homocidal ideation Allergy/Immunology= Medication/Food allergy as listed  Skin= Negative for Rash, lesions, ulcers, itching   Discharge Diagnoses:  Principal Problem:   HCAP (healthcare-associated pneumonia) Active Problems:   Dementia   PAF (paroxysmal atrial fibrillation) (HCC)   Acute respiratory distress, improved Healthcare acquired pneumonia, improving Acute metabolic encephalopathy, improved -Patient's cultures remain negative.  Initially was on vancomycin and cefepime later switched to Zosyn due to concerns of aspiration.  He passed speech and swallow but despite of that I have alerted them that he still at risk of silent aspiration therefore should always maintain aspiration precautions.  We will transition to oral Augmentin to complete 5-day course. -In the meantime he can use nebulizers as necessary, he is educated to use incentive spirometry and flutter valve that he can take home to enhance his improvement.  History of paroxysmal atrial fibrillation - Currently he is in normal sinus rhythm -Continue home regimen of Eliquis - Closely monitor electrolytes  Hypokalemia -Potassium repletion ordered  Hypothyroidism -Continue home Synthroid   DVT prophylaxis: On  Eliquis Code Status: DO NOT RESUSCITATE Family Communication:  Wife at bedside Disposition Plan: Likely discharge in next 24 hours   Discharge Instructions   Allergies as of 03/01/2018   No Known Allergies     Medication List    TAKE these medications   amoxicillin-clavulanate 875-125 MG tablet Commonly known as:  AUGMENTIN Take 1 tablet by mouth every 12 (twelve) hours for 5 days.   ELIQUIS 5 MG Tabs tablet Generic drug:  apixaban Take 5 mg by mouth 2 (two) times daily.   furosemide 20 MG tablet Commonly known as:  LASIX Take 20 mg by mouth.   ipratropium-albuterol 0.5-2.5 (3) MG/3ML Soln Commonly known as:  DUONEB Take 3 mLs by nebulization every 6 (six) hours as needed (Shortness of Breath).   levothyroxine 88 MCG tablet Commonly known as:  SYNTHROID, LEVOTHROID Take 88 mcg by mouth daily before breakfast.   potassium chloride 10 MEQ tablet Commonly known as:  K-DUR Take 4 tablets (40 mEq total) by mouth daily for 2 doses. Start taking on:  03/02/2018      Follow-up Information    [name], MD. Schedule an appointment as soon as possible for a visit in 10 day(s).   Specialty:  Internal Medicine Contact information: [address]. [place] [address] [phone]          No Known Allergies  You were cared for by a hospitalist during your hospital stay. If you have any questions about your discharge medications or the care you received while you were in the hospital after you are discharged, you can call the unit and asked to speak with the hospitalist on call if the hospitalist that took care of you is not available. Once you are discharged, your primary care physician will handle any further medical issues. Please note that no refills for any discharge medications will be authorized once you are discharged, as it is imperative that you return to your primary care physician (or establish a relationship with a primary care physician if  you do not have one) for your aftercare needs so that they can reassess your need for medications and monitor your lab values.  Consultations:  None   Procedures/Studies: Ct Head Wo Contrast  Result Date: 02/27/2018 CLINICAL DATA:  Altered level of consciousness. History of dementia, hypertension and atrial fibrillation. EXAM: CT HEAD WITHOUT CONTRAST TECHNIQUE: Contiguous axial images were obtained from the base of the skull through the vertex without intravenous contrast. COMPARISON:  None. FINDINGS: Mild motion degraded examination. BRAIN: No intraparenchymal hemorrhage, mass effect nor midline shift. Moderate parenchymal brain volume loss. No hydrocephalus. Confluent supratentorial white matter hypodensities. 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 No acute large vascular territory infarcts. No abnormal extra-axial fluid collections. Basal cisterns are patent. VASCULAR: Mild calcific atherosclerosis of the carotid siphons. SKULL: No skull fracture. Torus palatini. No significant scalp soft tissue swelling. SINUSES/ORBITS: The mastoid air-cells and included paranasal sinuses are well-aerated. Soft tissue LEFT external auditory canal most compatible with cerumen. Status post RIGHT ocular globe silicon injection and scleral banding. Status post LEFT ocular lens implant. OTHER: None. IMPRESSION: 1. Motion degraded examination. 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 2. Moderate parenchymal brain volume loss and moderate to severe chronic small vessel ischemic disease. Electronically Signed   By: Elon Alas M.D.   On: 02/27/2018 04:19   Dg Chest Portable 1 View  Result Date: 02/27/2018 CLINICAL DATA:  Fever for 2 days. EXAM: PORTABLE CHEST 1 VIEW COMPARISON:  None. FINDINGS: RIGHT lower lobe airspace opacity. Small RIGHT pleural  effusion. Cardiomediastinal silhouette is normal given rotation to the RIGHT, calcified aortic arch. No pneumothorax. Dual lead  LEFT cardiac pacemaker with lead tips projecting RIGHT atrium and RIGHT ventricle. No pneumothorax. Osteopenia. IMPRESSION: RIGHT lower lobe airspace opacity concerning for pneumonia with small RIGHT pleural effusion. Followup PA and lateral chest X-ray is recommended in 3-4 weeks following trial of antibiotic therapy to ensure resolution and exclude underlying malignancy. Aortic Atherosclerosis (ICD10-I70.0). Electronically Signed   By: Elon Alas M.D.   On: 02/27/2018 01:50     Subjective:   Discharge Exam: Vitals:   02/28/18 2300 03/01/18 0743  BP: 136/68 (!) 169/88  Pulse: 62 66  Resp: 16 14  Temp: 97.6 F (36.4 C) 98.5 F (36.9 C)  SpO2: 94% 94%   Vitals:   02/28/18 1527 02/28/18 1652 02/28/18 2300 03/01/18 0743  BP:   136/68 (!) 169/88  Pulse:   62 66  Resp: 16  16 14   Temp:  97.7 F (36.5 C) 97.6 F (36.4 C) 98.5 F (36.9 C)  TempSrc:  Axillary Axillary Oral  SpO2:  94% 94% 94%  Weight:      Height:        General: Pt is alert, awake, not in acute distress; elderly frail appearing.  Cardiovascular: RRR, S1/S2 +, no rubs, no gallops Respiratory: CTA bilaterally, no wheezing, no rhonchi Abdominal: Soft, NT, ND, bowel sounds + Extremities: no edema, no cyanosis    The results of significant diagnostics from this hospitalization (including imaging, microbiology, ancillary and laboratory) are listed below for reference.     Microbiology: Recent Results (from the past 240 hour(s))  Blood culture (routine x 2)     Status: None (Preliminary result)   Collection Time: 02/27/18  1:30 AM  Result Value Ref Range Status   Specimen Description BLOOD RIGHT ARM  Final   Special Requests   Final    BOTTLES DRAWN AEROBIC AND ANAEROBIC Blood Culture results may not be optimal due to an excessive volume of blood received in culture bottles   Culture   Final    NO GROWTH 1 DAY Performed at [facility], [place] [address], [place], [place] [address]    Report Status  PENDING  Incomplete  Blood culture (routine x 2)     Status: None (Preliminary result)   Collection Time: 02/27/18  1:45 AM  Result Value Ref Range Status   Specimen Description BLOOD LEFT HAND  Final   Special Requests   Final    BOTTLES DRAWN AEROBIC AND ANAEROBIC Blood Culture adequate volume   Culture   Final    NO GROWTH 1 DAY Performed at [facility], [place] [address], [place], [place] [address]    Report Status PENDING  Incomplete  Urine culture     Status: None   Collection Time: 02/27/18  2:03 AM  Result Value Ref Range Status   Specimen Description URINE, RANDOM  Final   Special Requests NONE  Final   Culture   Final    NO GROWTH Performed at [facility], [address] [place], [place] [address]    Report Status 02/28/2018 FINAL  Final     Labs: BNP (last 3 results) Recent Labs    02/27/18 0314  BNP 25.3   Basic Metabolic Panel: Recent Labs  Lab 02/27/18 0129 02/28/18 0251 03/01/18 0855  NA 136 135 135  K 3.4* 3.0* 2.9*  CL 100* 104 102  CO2 23 24 21*  GLUCOSE  130* 103* 150*  BUN 18 10 8   CREATININE 1.03 0.81 0.91  CALCIUM 9.0 8.4* 8.7*  MG  --   --  1.8   Liver Function Tests: Recent Labs  Lab 02/27/18 0129  AST 29  ALT 16*  ALKPHOS 62  BILITOT 0.6  PROT 6.3*  ALBUMIN 3.4*   No results for input(s): LIPASE, AMYLASE in the last 168 hours. No results for input(s): AMMONIA in the last 168 hours. CBC: Recent Labs  Lab 02/27/18 0129 02/28/18 0251  WBC 10.8* 8.3  NEUTROABS 9.4* 6.2  HGB 12.5* 10.1*  HCT 36.4* 30.6*  MCV 87.7 87.4  PLT 204 156   Cardiac Enzymes: No results for input(s): CKTOTAL, CKMB, CKMBINDEX, TROPONINI in the last 168 hours. BNP: Invalid input(s): POCBNP CBG: No results for input(s): GLUCAP in the last 168 hours. D-Dimer No results for input(s): DDIMER in the last 72 hours. Hgb A1c No results for input(s): HGBA1C in the last 72 hours. Lipid Profile No results for input(s): CHOL, HDL, LDLCALC,  TRIG, CHOLHDL, LDLDIRECT in the last 72 hours. Thyroid function studies No results for input(s): TSH, T4TOTAL, T3FREE, THYROIDAB in the last 72 hours.  Invalid input(s): FREET3 Anemia work up No results for input(s): VITAMINB12, FOLATE, FERRITIN, TIBC, IRON, RETICCTPCT in the last 72 hours. Urinalysis    Component Value Date/Time   COLORURINE YELLOW 02/27/2018 0203   APPEARANCEUR CLEAR 02/27/2018 0203   LABSPEC 1.017 02/27/2018 0203   PHURINE 7.0 02/27/2018 0203   GLUCOSEU NEGATIVE 02/27/2018 0203   HGBUR NEGATIVE 02/27/2018 0203   BILIRUBINUR NEGATIVE 02/27/2018 0203   KETONESUR NEGATIVE 02/27/2018 0203   PROTEINUR NEGATIVE 02/27/2018 0203   NITRITE NEGATIVE 02/27/2018 0203   LEUKOCYTESUR NEGATIVE 02/27/2018 0203   Sepsis Labs Invalid input(s): PROCALCITONIN,  WBC,  LACTICIDVEN Microbiology Recent Results (from the past 240 hour(s))  Blood culture (routine x 2)     Status: None (Preliminary result)   Collection Time: 02/27/18  1:30 AM  Result Value Ref Range Status   Specimen Description BLOOD RIGHT ARM  Final   Special Requests   Final    BOTTLES DRAWN AEROBIC AND ANAEROBIC Blood Culture results may not be optimal due to an excessive volume of blood received in culture bottles   Culture   Final    NO GROWTH 1 DAY Performed at [facility], [place] [address] [place], [place] [address]    Report Status PENDING  Incomplete  Blood culture (routine x 2)     Status: None (Preliminary result)   Collection Time: 02/27/18  1:45 AM  Result Value Ref Range Status   Specimen Description BLOOD LEFT HAND  Final   Special Requests   Final    BOTTLES DRAWN AEROBIC AND ANAEROBIC Blood Culture adequate volume   Culture   Final    NO GROWTH 1 DAY Performed at [facility], [place] [address], [place], [place] [address]    Report Status PENDING  Incomplete  Urine culture     Status: None   Collection Time: 02/27/18  2:03 AM  Result Value Ref Range Status   Specimen Description  URINE, RANDOM  Final   Special Requests NONE  Final   Culture   Final    NO GROWTH Performed at [facility], [address], [place], [place] [address]    Report Status 02/28/2018 FINAL  Final   You were cared for by a hospitalist during your hospital stay. If you have any questions about  your discharge medications or the care you received while you were in the hospital after you are discharged, you can call the unit and asked to speak with the hospitalist on call if the hospitalist that took care of you is not available. Once you are discharged, your primary care physician will handle any further medical issues. Please note that NO REFILLS for any discharge medications will be authorized once you are discharged, as it is imperative that you return to your primary care physician (or establish a relationship with a primary care physician if you do not have one) for your aftercare needs so that they can reassess your need for medications and monitor your lab values.  Please request your Prim.MD to go over all Hospital Tests and Procedure/Radiological results at the follow up, please get all Hospital records sent to your Prim MD by signing hospital release before you go home.  Get CBC, CMP, 2 view Chest X ray checked  by Primary MD during your next visit or SNF MD in 5-7 days ( we routinely change or add medications that can affect your baseline labs and fluid status, therefore we recommend that you get the mentioned basic workup next visit with your PCP, your PCP may decide not to get them or add new tests based on their clinical decision)  On your next visit with your primary care physician please Get Medicines reviewed and adjusted.  If you experience worsening of your admission symptoms, develop shortness of breath, life threatening emergency, suicidal or homicidal thoughts you must seek medical attention immediately by calling 911 or calling your MD immediately  if symptoms less  severe.  You Must read complete instructions/literature along with all the possible adverse reactions/side effects for all the Medicines you take and that have been prescribed to you. Take any new Medicines after you have completely understood and accpet all the possible adverse reactions/side effects.   Do not drive, operate heavy machinery, perform activities at heights, swimming or participation in water activities or provide baby sitting services if your were admitted for syncope or siezures until you have seen by Primary MD or a Neurologist and advised to do so again.  Do not drive when taking Pain medications.  Time coordinating discharge: 31 mins  SIGNED:   Damita Lack, MD  [facility] 03/01/2018, 2:00 PM Pager   If 7PM-7AM, please contact night-coverage [email or web address] Password [email or web address]

## 2018-03-01 NOTE — Progress Notes (Signed)
DC and fl2 sent to ALF for review- patient cleared for return  Patient will discharge to [facility] Anticipated discharge date: 4/18 Family notified: pt wife Transportation by pt family  CSW signing off.  Jorge Ny, LCSW Clinical Social Worker [phone]

## 2018-03-04 ENCOUNTER — Inpatient Hospital Stay (HOSPITAL_COMMUNITY): Payer: Medicare Other

## 2018-03-04 ENCOUNTER — Emergency Department (HOSPITAL_COMMUNITY): Payer: Medicare Other

## 2018-03-04 ENCOUNTER — Inpatient Hospital Stay (HOSPITAL_COMMUNITY)
Admission: EM | Admit: 2018-03-04 | Discharge: 2018-03-07 | DRG: 091 | Disposition: A | Discharge status: Nursing Home (Includes ICF) | Payer: Medicare Other | Attending: Internal Medicine | Admitting: Internal Medicine

## 2018-03-04 DIAGNOSIS — G92 Toxic encephalopathy: Principal | ICD-10-CM | POA: Diagnosis present

## 2018-03-04 DIAGNOSIS — F0281 Dementia in other diseases classified elsewhere with behavioral disturbance: Secondary | ICD-10-CM | POA: Diagnosis not present

## 2018-03-04 DIAGNOSIS — E039 Hypothyroidism, unspecified: Secondary | ICD-10-CM | POA: Diagnosis present

## 2018-03-04 DIAGNOSIS — Z9841 Cataract extraction status, right eye: Secondary | ICD-10-CM | POA: Diagnosis not present

## 2018-03-04 DIAGNOSIS — R509 Fever, unspecified: Secondary | ICD-10-CM | POA: Diagnosis not present

## 2018-03-04 DIAGNOSIS — R6510 Systemic inflammatory response syndrome (SIRS) of non-infectious origin without acute organ dysfunction: Secondary | ICD-10-CM | POA: Diagnosis present

## 2018-03-04 DIAGNOSIS — R1310 Dysphagia, unspecified: Secondary | ICD-10-CM | POA: Diagnosis present

## 2018-03-04 DIAGNOSIS — Z85828 Personal history of other malignant neoplasm of skin: Secondary | ICD-10-CM

## 2018-03-04 DIAGNOSIS — G9341 Metabolic encephalopathy: Secondary | ICD-10-CM

## 2018-03-04 DIAGNOSIS — Z8701 Personal history of pneumonia (recurrent): Secondary | ICD-10-CM | POA: Diagnosis not present

## 2018-03-04 DIAGNOSIS — Z79890 Hormone replacement therapy (postmenopausal): Secondary | ICD-10-CM

## 2018-03-04 DIAGNOSIS — I10 Essential (primary) hypertension: Secondary | ICD-10-CM | POA: Diagnosis present

## 2018-03-04 DIAGNOSIS — Z79899 Other long term (current) drug therapy: Secondary | ICD-10-CM

## 2018-03-04 DIAGNOSIS — A419 Sepsis, unspecified organism: Secondary | ICD-10-CM | POA: Diagnosis not present

## 2018-03-04 DIAGNOSIS — G039 Meningitis, unspecified: Secondary | ICD-10-CM

## 2018-03-04 DIAGNOSIS — F0390 Unspecified dementia without behavioral disturbance: Secondary | ICD-10-CM | POA: Diagnosis present

## 2018-03-04 DIAGNOSIS — Z9842 Cataract extraction status, left eye: Secondary | ICD-10-CM

## 2018-03-04 DIAGNOSIS — I495 Sick sinus syndrome: Secondary | ICD-10-CM | POA: Diagnosis present

## 2018-03-04 DIAGNOSIS — Z8711 Personal history of peptic ulcer disease: Secondary | ICD-10-CM

## 2018-03-04 DIAGNOSIS — Z95810 Presence of automatic (implantable) cardiac defibrillator: Secondary | ICD-10-CM

## 2018-03-04 DIAGNOSIS — Z8546 Personal history of malignant neoplasm of prostate: Secondary | ICD-10-CM

## 2018-03-04 DIAGNOSIS — J9601 Acute respiratory failure with hypoxia: Secondary | ICD-10-CM | POA: Diagnosis present

## 2018-03-04 DIAGNOSIS — Z9079 Acquired absence of other genital organ(s): Secondary | ICD-10-CM

## 2018-03-04 DIAGNOSIS — I480 Paroxysmal atrial fibrillation: Secondary | ICD-10-CM | POA: Diagnosis not present

## 2018-03-04 DIAGNOSIS — E785 Hyperlipidemia, unspecified: Secondary | ICD-10-CM | POA: Diagnosis present

## 2018-03-04 DIAGNOSIS — Z801 Family history of malignant neoplasm of trachea, bronchus and lung: Secondary | ICD-10-CM

## 2018-03-04 DIAGNOSIS — Z66 Do not resuscitate: Secondary | ICD-10-CM | POA: Diagnosis present

## 2018-03-04 DIAGNOSIS — J439 Emphysema, unspecified: Secondary | ICD-10-CM | POA: Diagnosis present

## 2018-03-04 DIAGNOSIS — R4182 Altered mental status, unspecified: Secondary | ICD-10-CM | POA: Diagnosis present

## 2018-03-04 DIAGNOSIS — Z7901 Long term (current) use of anticoagulants: Secondary | ICD-10-CM

## 2018-03-04 DIAGNOSIS — G309 Alzheimer's disease, unspecified: Secondary | ICD-10-CM | POA: Diagnosis not present

## 2018-03-04 DIAGNOSIS — E876 Hypokalemia: Secondary | ICD-10-CM | POA: Diagnosis not present

## 2018-03-04 DIAGNOSIS — D649 Anemia, unspecified: Secondary | ICD-10-CM | POA: Diagnosis not present

## 2018-03-04 DIAGNOSIS — Z87891 Personal history of nicotine dependence: Secondary | ICD-10-CM

## 2018-03-04 DIAGNOSIS — Z820 Family history of epilepsy and other diseases of the nervous system: Secondary | ICD-10-CM

## 2018-03-04 LAB — COMPREHENSIVE METABOLIC PANEL
ALBUMIN: 2.8 g/dL — AB (ref 3.5–5.0)
ALT: 24 U/L (ref 17–63)
AST: 44 U/L — ABNORMAL HIGH (ref 15–41)
Alkaline Phosphatase: 52 U/L (ref 38–126)
Anion gap: 11 (ref 5–15)
BUN: 23 mg/dL — AB (ref 6–20)
CHLORIDE: 107 mmol/L (ref 101–111)
CO2: 20 mmol/L — AB (ref 22–32)
Calcium: 8.7 mg/dL — ABNORMAL LOW (ref 8.9–10.3)
Creatinine, Ser: 1.02 mg/dL (ref 0.61–1.24)
GFR calc Af Amer: >60 mL/min (ref >60)
Glucose, Bld: 126 mg/dL — ABNORMAL HIGH (ref 65–99)
POTASSIUM: 3.9 mmol/L (ref 3.5–5.1)
SODIUM: 138 mmol/L (ref 135–145)
Total Bilirubin: 0.8 mg/dL (ref 0.3–1.2)
Total Protein: 6.1 g/dL — ABNORMAL LOW (ref 6.5–8.1)

## 2018-03-04 LAB — URINALYSIS, ROUTINE W REFLEX MICROSCOPIC
BACTERIA UA: NONE SEEN
Bilirubin Urine: NEGATIVE
GLUCOSE, UA: NEGATIVE mg/dL
Ketones, ur: 5 mg/dL — AB
Leukocytes, UA: NEGATIVE
NITRITE: NEGATIVE
PROTEIN: 30 mg/dL — AB
Specific Gravity, Urine: 1.025 (ref 1.005–1.030)
pH: 5 (ref 5.0–8.0)

## 2018-03-04 LAB — CULTURE, BLOOD (ROUTINE X 2)
Culture: NO GROWTH
Culture: NO GROWTH
Special Requests: ADEQUATE

## 2018-03-04 LAB — PROTIME-INR
INR: 1.51
Prothrombin Time: 18.1 seconds — ABNORMAL HIGH (ref 11.4–15.2)

## 2018-03-04 LAB — BASIC METABOLIC PANEL
Anion gap: 11 (ref 5–15)
BUN: 22 mg/dL — AB (ref 6–20)
CALCIUM: 8.7 mg/dL — AB (ref 8.9–10.3)
CO2: 20 mmol/L — ABNORMAL LOW (ref 22–32)
CREATININE: 0.97 mg/dL (ref 0.61–1.24)
Chloride: 107 mmol/L (ref 101–111)
GFR calc Af Amer: >60 mL/min (ref >60)
Glucose, Bld: 126 mg/dL — ABNORMAL HIGH (ref 65–99)
Potassium: 3.9 mmol/L (ref 3.5–5.1)
SODIUM: 138 mmol/L (ref 135–145)

## 2018-03-04 LAB — CBG MONITORING, ED: Glucose-Capillary: 136 mg/dL — ABNORMAL HIGH (ref 65–99)

## 2018-03-04 LAB — CBC
HCT: 27.2 % — ABNORMAL LOW (ref 39.0–52.0)
HCT: 29.1 % — ABNORMAL LOW (ref 39.0–52.0)
Hemoglobin: 9.4 g/dL — ABNORMAL LOW (ref 13.0–17.0)
Hemoglobin: 9.9 g/dL — ABNORMAL LOW (ref 13.0–17.0)
MCH: 29.7 pg (ref 26.0–34.0)
MCH: 30.4 pg (ref 26.0–34.0)
MCHC: 34 g/dL (ref 30.0–36.0)
MCHC: 34.6 g/dL (ref 30.0–36.0)
MCV: 87.4 fL (ref 78.0–100.0)
MCV: 88 fL (ref 78.0–100.0)
PLATELETS: 231 10*3/uL (ref 150–400)
Platelets: 225 10*3/uL (ref 150–400)
RBC: 3.09 MIL/uL — ABNORMAL LOW (ref 4.22–5.81)
RBC: 3.33 MIL/uL — ABNORMAL LOW (ref 4.22–5.81)
RDW: 13.6 % (ref 11.5–15.5)
RDW: 13.8 % (ref 11.5–15.5)
WBC: 14.6 10*3/uL — AB (ref 4.0–10.5)
WBC: 15.3 10*3/uL — ABNORMAL HIGH (ref 4.0–10.5)

## 2018-03-04 LAB — INFLUENZA PANEL BY PCR (TYPE A & B)
Influenza A By PCR: NEGATIVE
Influenza B By PCR: NEGATIVE

## 2018-03-04 LAB — PROCALCITONIN

## 2018-03-04 LAB — I-STAT CG4 LACTIC ACID, ED
Lactic Acid, Venous: 1.17 mmol/L (ref 0.5–1.9)
Lactic Acid, Venous: 1.91 mmol/L — ABNORMAL HIGH (ref 0.5–1.9)

## 2018-03-04 LAB — MRSA PCR SCREENING: MRSA BY PCR: POSITIVE — AB

## 2018-03-04 LAB — LACTIC ACID, PLASMA: Lactic Acid, Venous: 1.3 mmol/L (ref 0.5–1.9)

## 2018-03-04 LAB — APTT: APTT: 34 s (ref 24–36)

## 2018-03-04 MED ORDER — SODIUM CHLORIDE 0.9 % IV SOLN
INTRAVENOUS | Status: DC
  Administered 2018-03-04 – 2018-03-05 (×3): via INTRAVENOUS

## 2018-03-04 MED ORDER — DEXAMETHASONE SODIUM PHOSPHATE 10 MG/ML IJ SOLN
10.0000 mg | Freq: Once | INTRAMUSCULAR | Status: AC
Start: 2018-03-04 — End: 2018-03-04
  Administered 2018-03-04: 10 mg via INTRAVENOUS
  Filled 2018-03-04: qty 1

## 2018-03-04 MED ORDER — VANCOMYCIN HCL IN DEXTROSE 1-5 GM/200ML-% IV SOLN
1000.0000 mg | Freq: Two times a day (BID) | INTRAVENOUS | Status: DC
  Administered 2018-03-04 – 2018-03-07 (×6): 1000 mg via INTRAVENOUS
  Filled 2018-03-04 (×7): qty 200

## 2018-03-04 MED ORDER — ACETAMINOPHEN 325 MG PO TABS
650.0000 mg | ORAL_TABLET | Freq: Four times a day (QID) | ORAL | Status: DC | PRN
  Administered 2018-03-05: 650 mg via ORAL
  Filled 2018-03-04: qty 2

## 2018-03-04 MED ORDER — LEVOTHYROXINE SODIUM 100 MCG IV SOLR
50.0000 ug | Freq: Every day | INTRAVENOUS | Status: DC
  Administered 2018-03-04 – 2018-03-06 (×3): 50 ug via INTRAVENOUS
  Filled 2018-03-04 (×4): qty 5

## 2018-03-04 MED ORDER — PIPERACILLIN-TAZOBACTAM 3.375 G IVPB 30 MIN
3.3750 g | Freq: Once | INTRAVENOUS | Status: AC
  Administered 2018-03-04: 3.375 g via INTRAVENOUS
  Filled 2018-03-04: qty 50

## 2018-03-04 MED ORDER — DEXTROSE 5 % IV SOLN
800.0000 mg | Freq: Three times a day (TID) | INTRAVENOUS | Status: DC
  Administered 2018-03-04 – 2018-03-07 (×10): 800 mg via INTRAVENOUS
  Filled 2018-03-04 (×11): qty 16

## 2018-03-04 MED ORDER — SODIUM CHLORIDE 0.9 % IV SOLN
2.0000 g | Freq: Two times a day (BID) | INTRAVENOUS | Status: DC
  Administered 2018-03-04 – 2018-03-07 (×6): 2 g via INTRAVENOUS
  Filled 2018-03-04 (×6): qty 20

## 2018-03-04 MED ORDER — ONDANSETRON HCL 4 MG/2ML IJ SOLN: 4.0000 mg | Freq: Three times a day (TID) | INTRAMUSCULAR | Status: DC | PRN

## 2018-03-04 MED ORDER — HYDRALAZINE HCL 20 MG/ML IJ SOLN: 5.0000 mg | INTRAMUSCULAR | Status: DC | PRN

## 2018-03-04 MED ORDER — METOPROLOL TARTRATE 5 MG/5ML IV SOLN
2.5000 mg | Freq: Three times a day (TID) | INTRAVENOUS | Status: DC
  Administered 2018-03-04 – 2018-03-07 (×8): 2.5 mg via INTRAVENOUS
  Filled 2018-03-04 (×9): qty 5

## 2018-03-04 MED ORDER — VANCOMYCIN HCL 10 G IV SOLR
1500.0000 mg | Freq: Once | INTRAVENOUS | Status: AC
  Administered 2018-03-04: 1500 mg via INTRAVENOUS
  Filled 2018-03-04: qty 1500

## 2018-03-04 MED ORDER — SODIUM CHLORIDE 0.9 % IV SOLN
2.0000 g | INTRAVENOUS | Status: DC
  Administered 2018-03-04 – 2018-03-07 (×17): 2 g via INTRAVENOUS
  Filled 2018-03-04 (×20): qty 2000

## 2018-03-04 MED ORDER — ACETAMINOPHEN 650 MG RE SUPP
650.0000 mg | Freq: Four times a day (QID) | RECTAL | Status: DC | PRN
  Administered 2018-03-04: 650 mg via RECTAL
  Filled 2018-03-04: qty 1

## 2018-03-04 MED ORDER — DEXAMETHASONE 4 MG PO TABS: 10.0000 mg | ORAL_TABLET | Freq: Once | ORAL | Status: DC

## 2018-03-04 MED ORDER — SODIUM CHLORIDE 0.9 % IV BOLUS
1000.0000 mL | Freq: Once | INTRAVENOUS | Status: AC
  Administered 2018-03-04: 1000 mL via INTRAVENOUS

## 2018-03-04 MED ORDER — SODIUM CHLORIDE 0.9 % IV SOLN
3.0000 g | Freq: Four times a day (QID) | INTRAVENOUS | Status: DC
  Administered 2018-03-04: 3 g via INTRAVENOUS
  Filled 2018-03-04 (×3): qty 3

## 2018-03-04 NOTE — ED Triage Notes (Signed)
PT moved from B-14  To E 37 waiting for ADM bed. Family with PT. at the time. Pt sleeping ,Vitals WNL.

## 2018-03-04 NOTE — Progress Notes (Signed)
Pharmacy Antibiotic Note  Zachary Vasquez is a 77 y.o. male admitted on 03/04/2018 with sepsis with known recent HCAP, cannot exclude meningitis w/ current AMS.  Pharmacy has been consulted for Vancocin, Unasyn, Rocephin, and acyclovir dosing.  Plan: Rec'd vanc 1500mg  and Zosyn 3.375g in ED. Vancomycin 1000mg  IV every 12 hours.  Goal trough 15-20 mcg/mL.  Unasyn 3g IV every 6 hours. Rocephin 2g IV every 12 hours. Acyclovir 800mg  IV every 8 hours.  Height: 6' (182.9 cm) Weight: 180 lb (81.6 kg) IBW/kg (Calculated) : 77.6  Temp (24hrs), Avg:101.5 F (38.6 C), Min:101.2 F (38.4 C), Max:101.7 F (38.7 C)  Recent Labs  Lab 02/27/18 0129 02/27/18 0144 02/27/18 0319 02/28/18 0251 03/01/18 0855 03/04/18 0017 03/04/18 0024 03/04/18 0331  WBC 10.8*  --   --  8.3  --  14.6*  --   --   CREATININE 1.03  --   --  0.81 0.91 1.02  --   --   LATICACIDVEN  --  1.69 0.86  --   --   --  1.91* 1.17    Estimated Creatinine Clearance: 68.7 mL/min (by C-G formula based on SCr of 1.02 mg/dL).    No Known Allergies   Thank you for allowing pharmacy to be a part of this patient's care.  Wynona Neat, PharmD, BCPS  03/04/2018 4:51 AM
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
## 2018-03-04 NOTE — ED Notes (Signed)
Patient transported to CT 

## 2018-03-04 NOTE — Progress Notes (Signed)
  PROGRESS NOTE  Patient admitted earlier this morning. See H&P. Zachary Vasquez is a 77 y.o. male with medical history significant of hypertension, hyperlipidemia, atrial fibrillation on Eliquis, SSS, AICD placement, dementia (per wife, at baseline patient is confused regarding his place/location and is not very talkative, but is able to feed himself), prostate cancer, hypothyroidism, recent admission due to HCAP, who presents with altered mental status and fever. Patient was recently hospitalized from 04/16-04/18 due to HCAP and AMS. Pt was treated with IV antibiotics, initially with vancomycin and cefepime, then switched to Zosyn due to concerning for aspiration pneumonia. Pt was discharged on Augmentin for 5 days. Per his son, pt has improved initially, but on 4/20 pt became more confused than his baseline. At baseline, pt is not able to communicate due to dementia, but is more alert per his son. Patient also found to have fever and leukocytosis and was admitted with concern for meningitis as no other source of infectious process found.   On exam, patient is somnolent and difficult to arouse.  CXR with improvement from previous  UA negative for infection  Blood culture pending  With passive neck flexion, patient is resisting and it appears stiff to movement. CT head negative for acute change  Concern for meningitis. His last eliquis dose was 8pm 4/20. Spoke with IR physician today, patient needs to be off Eliquis 48 hours before LP will be considered. I plan to call radiology/flouro department Tuesday afternoon at [phone] if LP still needed then. Continue broad spectrum antibiotics and antiviral vanco/rocephin/ampilin/acyclovir and monitor mentation.   Spoke with wife at bedside and answered all questions.   Dessa Phi, DO [facility] [email or web address] Password [facility] 03/04/2018, 11:57 AM

## 2018-03-04 NOTE — ED Notes (Signed)
Nurse will start IV and get labs.

## 2018-03-04 NOTE — ED Notes (Signed)
Report rec'd from previous RN. Pt is awaiting bed placement. On assessment, eyes open and tracking in room, garbled speech, mae x 4 spontaneously, fidgeting with bed covers. VSS at this time. Pt wife at the bedside, and aware of plan of care.

## 2018-03-04 NOTE — ED Triage Notes (Signed)
Pt BIB [facility] from [facility], pt hx dementia, staff reports pt more altered than normal with a temp of 102.6. Pt not answering questions at this time. EMS vitals: BP 158/96, HR 88, RR 18, SpO2 94% 4L [place]. GIven 500cc NS PTA.
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
## 2018-03-05 LAB — CBC
HEMATOCRIT: 26.1 % — AB (ref 39.0–52.0)
HEMOGLOBIN: 8.7 g/dL — AB (ref 13.0–17.0)
MCH: 29.4 pg (ref 26.0–34.0)
MCHC: 33.3 g/dL (ref 30.0–36.0)
MCV: 88.2 fL (ref 78.0–100.0)
Platelets: 234 10*3/uL (ref 150–400)
RBC: 2.96 MIL/uL — ABNORMAL LOW (ref 4.22–5.81)
RDW: 13.5 % (ref 11.5–15.5)
WBC: 15 10*3/uL — ABNORMAL HIGH (ref 4.0–10.5)

## 2018-03-05 LAB — URINE CULTURE: Culture: NO GROWTH

## 2018-03-05 LAB — BASIC METABOLIC PANEL
ANION GAP: 6 (ref 5–15)
BUN: 19 mg/dL (ref 6–20)
CHLORIDE: 107 mmol/L (ref 101–111)
CO2: 23 mmol/L (ref 22–32)
Calcium: 8.5 mg/dL — ABNORMAL LOW (ref 8.9–10.3)
Creatinine, Ser: 0.73 mg/dL (ref 0.61–1.24)
GFR calc non Af Amer: >60 mL/min (ref >60)
Glucose, Bld: 118 mg/dL — ABNORMAL HIGH (ref 65–99)
POTASSIUM: 3.6 mmol/L (ref 3.5–5.1)
Sodium: 136 mmol/L (ref 135–145)

## 2018-03-05 MED ORDER — CHLORHEXIDINE GLUCONATE CLOTH 2 % EX PADS
6.0000 | MEDICATED_PAD | Freq: Every day | CUTANEOUS | Status: DC
  Administered 2018-03-05 – 2018-03-07 (×3): 6 via TOPICAL

## 2018-03-05 MED ORDER — MUPIROCIN 2 % EX OINT
1.0000 "application " | TOPICAL_OINTMENT | Freq: Two times a day (BID) | CUTANEOUS | Status: DC
  Administered 2018-03-05 – 2018-03-07 (×6): 1 via NASAL
  Filled 2018-03-05: qty 22

## 2018-03-05 NOTE — Progress Notes (Signed)
PROGRESS NOTE    Zachary Vasquez  JJO:[id number] DOB: Mar 21, 1941 DOA: 03/04/2018 PCP: [name], MD     Brief Narrative:  Zachary Vasquez a 77 y.o.malewith medical history significant ofhypertension, hyperlipidemia, atrial fibrillation on Eliquis, SSS, AICD placement, dementia (per wife, at baseline patient is confused regarding his place/location and is not very talkative, but is able to feed himself), prostate cancer, hypothyroidism, recent admission due to HCAP, who presents with altered mental status and fever. Patient was recently hospitalized from 04/16-04/18 due to HCAP and AMS. Pt was treated with IV antibiotics, initially with vancomycin and cefepime, then switched to Zosyn due to concerning for aspiration pneumonia. Pt was discharged on Augmentin for 5 days. Per his son, pt has improved initially, but on 4/20 pt became more confused than his baseline. At baseline, pt is not able to communicate due to dementia, but is more alert per his son. Patient also found to have fever and leukocytosis and was admitted with concern for meningitis as no other source of infectious process found.   Assessment & Plan:   Principal Problem:   Sepsis ([place]) Active Problems:   Dementia   PAF (paroxysmal atrial fibrillation) (HCC)   Normocytic anemia   Essential hypertension   Hypothyroidism   Acute metabolic encephalopathy  Acute toxic encephalopathy -Concern for meningitis causing altered mentation -CT head negative for acute change -Patient much improved today; he is alert to voice, answers "fine" when asked how he's doing. Does not follow commands. Per wife, he appears back to baseline with patient's underlying dementia   Sepsis secondary to suspected meningitis -CXR and UA are negative for infectious process, influenza negative  -Blood culture pending -With passive neck flexion, patient resists and neck appears stiff to movement -Unfortunately, last Eliquis dose was 4/20 8pm  and unable to complete LP. Spoke with IR yesterday; hold Eliquis 48 hours and if LP still desired, can call flouro department  -Continue broad spectrum antibiotics, antiviral vanco/rocephin/ampicilin/acyclovir for now  -Afebrile now, WBC still elevated 15  -Continue droplet precaution  -IVF   A Fib -Hold eliquis  Hypothyroidism -Continue synthroid IV   Dysphagia -SLP consulted, continue soft diet today     DVT prophylaxis: SCD Code Status: DNR Family Communication: Spoke with wife over the phone with updates Disposition Plan: Pending improvement    Consultants:   None  Procedures:   None   Antimicrobials:  Anti-infectives (From admission, onward)   Start     Dose/Rate Route Frequency Ordered Stop   03/04/18 1600  vancomycin (VANCOCIN) IVPB 1000 mg/200 mL premix     1,000 mg 200 mL/hr over 60 Minutes Intravenous Every 12 hours 03/04/18 0505     03/04/18 1600  ampicillin (OMNIPEN) 2 g in sodium chloride 0.9 % 100 mL IVPB     2 g 300 mL/hr over 20 Minutes Intravenous Every 4 hours 03/04/18 1522     03/04/18 0900  Ampicillin-Sulbactam (UNASYN) 3 g in sodium chloride 0.9 % 100 mL IVPB  Status:  Discontinued     3 g 200 mL/hr over 30 Minutes Intravenous Every 6 hours 03/04/18 0505 03/04/18 1522   03/04/18 0600  acyclovir (ZOVIRAX) 800 mg in dextrose 5 % 150 mL IVPB     800 mg 166 mL/hr over 60 Minutes Intravenous Every 8 hours 03/04/18 0505     03/04/18 0600  cefTRIAXone (ROCEPHIN) 2 g in sodium chloride 0.9 % 100 mL IVPB     2 g 200 mL/hr over 30 Minutes Intravenous Every 12  hours 03/04/18 0505     03/04/18 0345  vancomycin (VANCOCIN) 1,500 mg in sodium chloride 0.9 % 500 mL IVPB     1,500 mg 250 mL/hr over 120 Minutes Intravenous  Once 03/04/18 0339 03/04/18 0630   03/04/18 0345  piperacillin-tazobactam (ZOSYN) IVPB 3.375 g     3.375 g 100 mL/hr over 30 Minutes Intravenous  Once 03/04/18 0339 03/04/18 0446       Subjective: Patient with dementia and unable to  answer ROS   Objective: Vitals:   03/04/18 1800 03/04/18 2104 03/04/18 2126 03/05/18 0651  BP: (!) 151/78 (!) 159/80  (!) 150/79  Pulse:  85 89 78  Resp: 16 18  18   Temp:  98.9 F (37.2 C)  98.1 F (36.7 C)  TempSrc:  Oral  Oral  SpO2:  95%  95%  Weight:      Height:        Intake/Output Summary (Last 24 hours) at 03/05/2018 1334 Last data filed at 03/05/2018 0858 Gross per 24 hour  Intake 3216.75 ml  Output 700 ml  Net 2516.75 ml   Filed Weights   03/04/18 0101  Weight: 81.6 kg (180 lb)    Examination:  General exam: Appears calm and comfortable  Respiratory system: Clear to auscultation. Respiratory effort normal. Cardiovascular system: S1 & S2 heard, RRR. No JVD, murmurs, rubs, gallops or clicks. No pedal edema. Gastrointestinal system: Abdomen is nondistended, soft and nontender. No organomegaly or masses felt. Normal bowel sounds heard. Central nervous system: Alert to voice, does not follow command  Extremities: Symmetric  Skin: No rashes, lesions or ulcers Psychiatry: Dementia   Data Reviewed: I have personally reviewed following labs and imaging studies  CBC: Recent Labs  Lab 02/27/18 0129 02/28/18 0251 03/04/18 0017 03/04/18 0524 03/05/18 0538  WBC 10.8* 8.3 14.6* 15.3* 15.0*  NEUTROABS 9.4* 6.2  --   --   --   HGB 12.5* 10.1* 9.9* 9.4* 8.7*  HCT 36.4* 30.6* 29.1* 27.2* 26.1*  MCV 87.7 87.4 87.4 88.0 88.2  PLT 204 156 225 231 382   Basic Metabolic Panel: Recent Labs  Lab 02/28/18 0251 03/01/18 0855 03/04/18 0017 03/04/18 0524 03/05/18 0538  NA 135 135 138 138 136  K 3.0* 2.9* 3.9 3.9 3.6  CL 104 102 107 107 107  CO2 24 21* 20* 20* 23  GLUCOSE 103* 150* 126* 126* 118*  BUN 10 8 23* 22* 19  CREATININE 0.81 0.91 1.02 0.97 0.73  CALCIUM 8.4* 8.7* 8.7* 8.7* 8.5*  MG  --  1.8  --   --   --    GFR: Estimated Creatinine Clearance: 87.6 mL/min (by C-G formula based on SCr of 0.73 mg/dL). Liver Function Tests: Recent Labs  Lab 02/27/18 0129  03/04/18 0017  AST 29 44*  ALT 16* 24  ALKPHOS 62 52  BILITOT 0.6 0.8  PROT 6.3* 6.1*  ALBUMIN 3.4* 2.8*   No results for input(s): LIPASE, AMYLASE in the last 168 hours. No results for input(s): AMMONIA in the last 168 hours. Coagulation Profile: Recent Labs  Lab 02/27/18 0129 03/04/18 0524  INR 1.17 1.51   Cardiac Enzymes: No results for input(s): CKTOTAL, CKMB, CKMBINDEX, TROPONINI in the last 168 hours. BNP (last 3 results) No results for input(s): PROBNP in the last 8760 hours. HbA1C: No results for input(s): HGBA1C in the last 72 hours. CBG: Recent Labs  Lab 03/04/18 0942  GLUCAP 136*   Lipid Profile: No results for input(s): CHOL, HDL, LDLCALC, TRIG, CHOLHDL,  LDLDIRECT in the last 72 hours. Thyroid Function Tests: No results for input(s): TSH, T4TOTAL, FREET4, T3FREE, THYROIDAB in the last 72 hours. Anemia Panel: No results for input(s): VITAMINB12, FOLATE, FERRITIN, TIBC, IRON, RETICCTPCT in the last 72 hours. Sepsis Labs: Recent Labs  Lab 02/27/18 0319 03/04/18 0024 03/04/18 0331 03/04/18 0524  PROCALCITON  --   --   --  <0.10  LATICACIDVEN 0.86 1.91* 1.17 1.3    Recent Results (from the past 240 hour(s))  Blood culture (routine x 2)     Status: None   Collection Time: 02/27/18  1:30 AM  Result Value Ref Range Status   Specimen Description BLOOD RIGHT ARM  Final   Special Requests   Final    BOTTLES DRAWN AEROBIC AND ANAEROBIC Blood Culture results may not be optimal due to an excessive volume of blood received in culture bottles   Culture   Final    NO GROWTH 5 DAYS Performed at [facility], [place] [address], [place], [place] [address]    Report Status 03/04/2018 FINAL  Final  Blood culture (routine x 2)     Status: None   Collection Time: 02/27/18  1:45 AM  Result Value Ref Range Status   Specimen Description BLOOD LEFT HAND  Final   Special Requests   Final    BOTTLES DRAWN AEROBIC AND ANAEROBIC Blood Culture adequate volume   Culture    Final    NO GROWTH 5 DAYS Performed at [facility], [place] [address], [place], [place] [address]    Report Status 03/04/2018 FINAL  Final  Urine culture     Status: None   Collection Time: 02/27/18  2:03 AM  Result Value Ref Range Status   Specimen Description URINE, RANDOM  Final   Special Requests NONE  Final   Culture   Final    NO GROWTH Performed at [facility], [place] [address] [place], [place] [address]    Report Status 02/28/2018 FINAL  Final  Urine Culture     Status: None   Collection Time: 03/04/18  1:55 AM  Result Value Ref Range Status   Specimen Description URINE, RANDOM  Final   Special Requests NONE  Final   Culture   Final    NO GROWTH Performed at [facility], [place] [address], [place], [place] [address]    Report Status 03/05/2018 FINAL  Final  Culture, blood (Routine X 2) w Reflex to ID Panel     Status: None (Preliminary result)   Collection Time: 03/04/18  4:05 AM  Result Value Ref Range Status   Specimen Description BLOOD RIGHT HAND  Final   Special Requests   Final    BOTTLES DRAWN AEROBIC AND ANAEROBIC Blood Culture adequate volume   Culture   Final    NO GROWTH 1 DAY Performed at [facility], [place] [address] [place], [place] [address]    Report Status PENDING  Incomplete  Culture, blood (Routine X 2) w Reflex to ID Panel     Status: None (Preliminary result)   Collection Time: 03/04/18  4:15 AM  Result Value Ref Range Status   Specimen Description BLOOD LEFT HAND  Final   Special Requests   Final    BOTTLES DRAWN AEROBIC AND ANAEROBIC Blood Culture results may not be optimal due to an inadequate volume of blood received in culture bottles   Culture   Final    NO GROWTH 1 DAY  Performed at [facility], [place] [address] [place], [place] [address]    Report Status PENDING  Incomplete  MRSA PCR Screening     Status: Abnormal   Collection Time: 03/04/18  8:32 PM  Result Value Ref Range Status   MRSA by PCR POSITIVE  (A) NEGATIVE Final    Comment:        The GeneXpert MRSA Assay (FDA approved for NASAL specimens only), is one component of a comprehensive MRSA colonization surveillance program. It is not intended to diagnose MRSA infection nor to guide or monitor treatment for MRSA infections. RESULT CALLED TO, READ BACK BY AND VERIFIED WITH: LOFTIN,Q RN [id number] 03/04/18 MITCHELL,L        Radiology Studies: Dg Chest 1 View  Result Date: 03/04/2018 CLINICAL DATA:  Altered mental status EXAM: CHEST  1 VIEW COMPARISON:  02/27/2018 FINDINGS: Left-sided pacing device as before. Hyperinflation with emphysematous disease. Possible small right pleural effusion. Minimal atelectasis or infiltrate at the right base. Stable cardiomediastinal silhouette with aortic atherosclerosis. IMPRESSION: 1. Possible tiny right effusion with hazy atelectasis or minimal infiltrate at the right base. 2. Emphysematous disease Electronically Signed   By: Donavan Foil M.D.   On: 03/04/2018 00:51   Ct Head Wo Contrast  Result Date: 03/04/2018 CLINICAL DATA:  Altered mental status. Febrile. History of dementia. EXAM: CT HEAD WITHOUT CONTRAST TECHNIQUE: Contiguous axial images were obtained from the base of the skull through the vertex without intravenous contrast. COMPARISON:  02/27/2018 FINDINGS: Brain: There is no evidence of acute large territory infarct, intracranial hemorrhage, mass, midline shift, or extra-axial fluid collection. 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 Patchy to confluent hypodensities throughout the cerebral white matter bilaterally are unchanged and nonspecific but compatible with moderate to severe chronic small vessel ischemic disease. There is moderate cerebral atrophy. Vascular: Calcified atherosclerosis at the skull base. No hyperdense vessel. Skull: No fracture or focal osseous lesion. Sinuses/Orbits: Visualized paranasal sinuses and mastoid air cells are clear. Postoperative changes to the  globes. Other: None. IMPRESSION: 1. No evidence of acute intracranial abnormality. 2. Moderate to severe chronic small vessel ischemic disease and moderate cerebral atrophy. Electronically Signed   By: Logan Bores M.D.   On: 03/04/2018 07:14      Scheduled Meds: . Chlorhexidine Gluconate Cloth  6 each Topical Q0600  . levothyroxine  50 mcg Intravenous Daily  . metoprolol tartrate  2.5 mg Intravenous Q8H  . mupirocin ointment  1 application Nasal BID   Continuous Infusions: . sodium chloride 75 mL/hr at 03/05/18 0600  . acyclovir Stopped (03/05/18 0700)  . ampicillin (OMNIPEN) IV 2 g (03/05/18 1257)  . cefTRIAXone (ROCEPHIN)  IV Stopped (03/05/18 0551)  . vancomycin Stopped (03/05/18 0502)     LOS: 1 day    Time spent: 35 minutes   Dessa Phi, DO [facility] [email or web address] Password [email or web address] 03/05/2018, 1:34 PM

## 2018-03-06 ENCOUNTER — Inpatient Hospital Stay (HOSPITAL_COMMUNITY): Payer: Medicare Other

## 2018-03-06 LAB — BASIC METABOLIC PANEL
Anion gap: 8 (ref 5–15)
BUN: 16 mg/dL (ref 6–20)
CHLORIDE: 107 mmol/L (ref 101–111)
CO2: 21 mmol/L — ABNORMAL LOW (ref 22–32)
Calcium: 8.3 mg/dL — ABNORMAL LOW (ref 8.9–10.3)
Creatinine, Ser: 0.81 mg/dL (ref 0.61–1.24)
Glucose, Bld: 110 mg/dL — ABNORMAL HIGH (ref 65–99)
POTASSIUM: 3.4 mmol/L — AB (ref 3.5–5.1)
SODIUM: 136 mmol/L (ref 135–145)

## 2018-03-06 LAB — CSF CELL COUNT WITH DIFFERENTIAL
RBC COUNT CSF: 6 /mm3 — AB
TUBE #: 1
WBC CSF: 0 /mm3 (ref 0–5)

## 2018-03-06 LAB — GLUCOSE, CAPILLARY: GLUCOSE-CAPILLARY: 105 mg/dL — AB (ref 65–99)

## 2018-03-06 LAB — CBC
HCT: 27.1 % — ABNORMAL LOW (ref 39.0–52.0)
HEMOGLOBIN: 9.1 g/dL — AB (ref 13.0–17.0)
MCH: 29.7 pg (ref 26.0–34.0)
MCHC: 33.6 g/dL (ref 30.0–36.0)
MCV: 88.6 fL (ref 78.0–100.0)
PLATELETS: 261 10*3/uL (ref 150–400)
RBC: 3.06 MIL/uL — AB (ref 4.22–5.81)
RDW: 13.2 % (ref 11.5–15.5)
WBC: 13.8 10*3/uL — ABNORMAL HIGH (ref 4.0–10.5)

## 2018-03-06 LAB — GLUCOSE, CSF: GLUCOSE CSF: 58 mg/dL (ref 40–70)

## 2018-03-06 LAB — PROTEIN, CSF: Total  Protein, CSF: 39 mg/dL (ref 15–45)

## 2018-03-06 MED ORDER — LORAZEPAM 2 MG/ML IJ SOLN
1.0000 mg | Freq: Once | INTRAMUSCULAR | Status: AC | PRN
  Administered 2018-03-06: 1 mg via INTRAVENOUS
  Filled 2018-03-06: qty 1

## 2018-03-06 MED ORDER — LIDOCAINE HCL (PF) 1 % IJ SOLN
INTRAMUSCULAR | Status: AC
  Administered 2018-03-06: 5 mL via INTRADERMAL
  Filled 2018-03-06: qty 5

## 2018-03-06 MED ORDER — LEVOTHYROXINE SODIUM 88 MCG PO TABS
88.0000 ug | ORAL_TABLET | Freq: Every day | ORAL | Status: DC
  Administered 2018-03-07: 88 ug via ORAL
  Filled 2018-03-06: qty 1

## 2018-03-06 MED ORDER — LIDOCAINE HCL (PF) 1 % IJ SOLN
5.0000 mL | Freq: Once | INTRAMUSCULAR | Status: AC
  Administered 2018-03-06: 5 mL via INTRADERMAL
  Filled 2018-03-06: qty 5

## 2018-03-06 MED ORDER — POTASSIUM CHLORIDE CRYS ER 20 MEQ PO TBCR
40.0000 meq | EXTENDED_RELEASE_TABLET | Freq: Once | ORAL | Status: AC
  Administered 2018-03-06: 40 meq via ORAL
  Filled 2018-03-06: qty 2

## 2018-03-06 NOTE — Progress Notes (Signed)
PROGRESS NOTE    Zachary Vasquez  UYQ:[id number] DOB: 1941-11-12 DOA: 03/04/2018 PCP: [name], MD     Brief Narrative:  Zachary Vasquez a 77 y.o.malewith medical history significant ofhypertension, hyperlipidemia, atrial fibrillation on Eliquis, SSS, AICD placement, dementia (per wife, at baseline patient is confused regarding his place/location and is not very talkative, but is able to feed himself), prostate cancer, hypothyroidism, recent admission due to HCAP, who presents with altered mental status and fever. Patient was recently hospitalized from 04/16-04/18 due to HCAP and AMS. Pt was treated with IV antibiotics, initially with vancomycin and cefepime, then switched to Zosyn due to concerning for aspiration pneumonia. Pt was discharged on Augmentin for 5 days. Per his son, pt has improved initially, but on 4/20 pt became more confused than his baseline. At baseline, pt is not able to communicate due to dementia, but is more alert per his son. Patient also found to have fever and leukocytosis and was admitted with concern for meningitis as no other source of infectious process found.   Assessment & Plan:   Principal Problem:   Sepsis ([place]) Active Problems:   Dementia   PAF (paroxysmal atrial fibrillation) (HCC)   Normocytic anemia   Essential hypertension   Hypothyroidism   Acute metabolic encephalopathy  Acute toxic encephalopathy -Concern for meningitis causing altered mentation -CT head negative for acute change -Patient much improved; he is alert to voice. Does not follow commands. Ate breakfast. Per wife, he is back to baseline with patient's underlying dementia   Sepsis secondary to suspected meningitis -CXR and UA are negative for infectious process, influenza negative  -Blood culture pending -With passive neck flexion, patient resists and neck appears stiff to movement -Unfortunately, last Eliquis dose was 4/20 8pm and unable to complete LP at time of  admission  -Continue broad spectrum antibiotics, antiviral vanco/rocephin/ampicilin/acyclovir for now  -Febrile overnight, WBC still elevated 13.8  -Continue droplet precaution  -Discussed in phone consultation with ID today; reasonable to pursue LP  -Spoke with radiology; plan for LP today   A Fib -Hold eliquis  Hypothyroidism -Continue synthroid  Dysphagia -Wife declined SLP eval     DVT prophylaxis: SCD Code Status: DNR Family Communication: Wife at bedside  Disposition Plan: Pending LP and improvement in sepsis    Consultants:   None  Procedures:   None   Antimicrobials:  Anti-infectives (From admission, onward)   Start     Dose/Rate Route Frequency Ordered Stop   03/04/18 1600  vancomycin (VANCOCIN) IVPB 1000 mg/200 mL premix     1,000 mg 200 mL/hr over 60 Minutes Intravenous Every 12 hours 03/04/18 0505     03/04/18 1600  ampicillin (OMNIPEN) 2 g in sodium chloride 0.9 % 100 mL IVPB     2 g 300 mL/hr over 20 Minutes Intravenous Every 4 hours 03/04/18 1522     03/04/18 0900  Ampicillin-Sulbactam (UNASYN) 3 g in sodium chloride 0.9 % 100 mL IVPB  Status:  Discontinued     3 g 200 mL/hr over 30 Minutes Intravenous Every 6 hours 03/04/18 0505 03/04/18 1522   03/04/18 0600  acyclovir (ZOVIRAX) 800 mg in dextrose 5 % 150 mL IVPB     800 mg 166 mL/hr over 60 Minutes Intravenous Every 8 hours 03/04/18 0505     03/04/18 0600  cefTRIAXone (ROCEPHIN) 2 g in sodium chloride 0.9 % 100 mL IVPB     2 g 200 mL/hr over 30 Minutes Intravenous Every 12 hours 03/04/18 0505  03/04/18 0345  vancomycin (VANCOCIN) 1,500 mg in sodium chloride 0.9 % 500 mL IVPB     1,500 mg 250 mL/hr over 120 Minutes Intravenous  Once 03/04/18 0339 03/04/18 0630   03/04/18 0345  piperacillin-tazobactam (ZOSYN) IVPB 3.375 g     3.375 g 100 mL/hr over 30 Minutes Intravenous  Once 03/04/18 0339 03/04/18 0446       Subjective: Patient with dementia and unable to answer ROS    Objective: Vitals:   03/05/18 2235 03/06/18 0018 03/06/18 0200 03/06/18 0400  BP: (!) 143/83 123/66 (!) 128/57 (!) 112/53  Pulse: 79 72 63 (!) 59  Resp:    18  Temp: (!) 100.6 F (38.1 C) 99.9 F (37.7 C)    TempSrc: Oral Oral  Oral  SpO2:    98%  Weight:      Height:        Intake/Output Summary (Last 24 hours) at 03/06/2018 1130 Last data filed at 03/06/2018 0500 Gross per 24 hour  Intake 2282 ml  Output 350 ml  Net 1932 ml   Filed Weights   03/04/18 0101  Weight: 81.6 kg (180 lb)    Examination: General exam: Appears calm and comfortable  Respiratory system: Clear to auscultation. Respiratory effort normal. Cardiovascular system: S1 & S2 heard, RRR. No JVD, murmurs, rubs, gallops or clicks. No pedal edema. Gastrointestinal system: Abdomen is nondistended, soft and nontender. No organomegaly or masses felt. Normal bowel sounds heard. Central nervous system: Alert  Extremities: Symmetric 5 x 5 power. Skin: No rashes, lesions or ulcers Psychiatry: Dementia    Data Reviewed: I have personally reviewed following labs and imaging studies  CBC: Recent Labs  Lab 02/28/18 0251 03/04/18 0017 03/04/18 0524 03/05/18 0538 03/06/18 0450  WBC 8.3 14.6* 15.3* 15.0* 13.8*  NEUTROABS 6.2  --   --   --   --   HGB 10.1* 9.9* 9.4* 8.7* 9.1*  HCT 30.6* 29.1* 27.2* 26.1* 27.1*  MCV 87.4 87.4 88.0 88.2 88.6  PLT 156 225 231 234 517   Basic Metabolic Panel: Recent Labs  Lab 03/01/18 0855 03/04/18 0017 03/04/18 0524 03/05/18 0538 03/06/18 0450  NA 135 138 138 136 136  K 2.9* 3.9 3.9 3.6 3.4*  CL 102 107 107 107 107  CO2 21* 20* 20* 23 21*  GLUCOSE 150* 126* 126* 118* 110*  BUN 8 23* 22* 19 16  CREATININE 0.91 1.02 0.97 0.73 0.81  CALCIUM 8.7* 8.7* 8.7* 8.5* 8.3*  MG 1.8  --   --   --   --    GFR: Estimated Creatinine Clearance: 86.5 mL/min (by C-G formula based on SCr of 0.81 mg/dL). Liver Function Tests: Recent Labs  Lab 03/04/18 0017  AST 44*  ALT 24   ALKPHOS 52  BILITOT 0.8  PROT 6.1*  ALBUMIN 2.8*   No results for input(s): LIPASE, AMYLASE in the last 168 hours. No results for input(s): AMMONIA in the last 168 hours. Coagulation Profile: Recent Labs  Lab 03/04/18 0524  INR 1.51   Cardiac Enzymes: No results for input(s): CKTOTAL, CKMB, CKMBINDEX, TROPONINI in the last 168 hours. BNP (last 3 results) No results for input(s): PROBNP in the last 8760 hours. HbA1C: No results for input(s): HGBA1C in the last 72 hours. CBG: Recent Labs  Lab 03/04/18 0942 03/06/18 0824  GLUCAP 136* 105*   Lipid Profile: No results for input(s): CHOL, HDL, LDLCALC, TRIG, CHOLHDL, LDLDIRECT in the last 72 hours. Thyroid Function Tests: No results for input(s): TSH,  T4TOTAL, FREET4, T3FREE, THYROIDAB in the last 72 hours. Anemia Panel: No results for input(s): VITAMINB12, FOLATE, FERRITIN, TIBC, IRON, RETICCTPCT in the last 72 hours. Sepsis Labs: Recent Labs  Lab 03/04/18 0024 03/04/18 0331 03/04/18 0524  PROCALCITON  --   --  <0.10  LATICACIDVEN 1.91* 1.17 1.3    Recent Results (from the past 240 hour(s))  Blood culture (routine x 2)     Status: None   Collection Time: 02/27/18  1:30 AM  Result Value Ref Range Status   Specimen Description BLOOD RIGHT ARM  Final   Special Requests   Final    BOTTLES DRAWN AEROBIC AND ANAEROBIC Blood Culture results may not be optimal due to an excessive volume of blood received in culture bottles   Culture   Final    NO GROWTH 5 DAYS Performed at [facility], [place] [address], [place], [place] [address]    Report Status 03/04/2018 FINAL  Final  Blood culture (routine x 2)     Status: None   Collection Time: 02/27/18  1:45 AM  Result Value Ref Range Status   Specimen Description BLOOD LEFT HAND  Final   Special Requests   Final    BOTTLES DRAWN AEROBIC AND ANAEROBIC Blood Culture adequate volume   Culture   Final    NO GROWTH 5 DAYS Performed at [facility], [place] [address], [place], [place] [address]    Report Status 03/04/2018 FINAL  Final  Urine culture     Status: None   Collection Time: 02/27/18  2:03 AM  Result Value Ref Range Status   Specimen Description URINE, RANDOM  Final   Special Requests NONE  Final   Culture   Final    NO GROWTH Performed at [facility], [place] [address] [place], [place] [address]    Report Status 02/28/2018 FINAL  Final  Urine Culture     Status: None   Collection Time: 03/04/18  1:55 AM  Result Value Ref Range Status   Specimen Description URINE, RANDOM  Final   Special Requests NONE  Final   Culture   Final    NO GROWTH Performed at [facility], [place] [address], [place], [place] [address]    Report Status 03/05/2018 FINAL  Final  Culture, blood (Routine X 2) w Reflex to ID Panel     Status: None (Preliminary result)   Collection Time: 03/04/18  4:05 AM  Result Value Ref Range Status   Specimen Description BLOOD RIGHT HAND  Final   Special Requests   Final    BOTTLES DRAWN AEROBIC AND ANAEROBIC Blood Culture adequate volume   Culture   Final    NO GROWTH 2 DAYS Performed at [facility], [place] [address] [place], [place] [address]    Report Status PENDING  Incomplete  Culture, blood (Routine X 2) w Reflex to ID Panel     Status: None (Preliminary result)   Collection Time: 03/04/18  4:15 AM  Result Value Ref Range Status   Specimen Description BLOOD LEFT HAND  Final   Special Requests   Final    BOTTLES DRAWN AEROBIC AND ANAEROBIC Blood Culture results may not be optimal due to an inadequate volume of blood received in culture bottles   Culture   Final    NO GROWTH 2 DAYS Performed at [facility], [place] [address], [place], [place] [address]    Report Status PENDING  Incomplete  MRSA PCR Screening     Status: Abnormal   Collection Time: 03/04/18  8:32 PM  Result Value Ref Range Status   MRSA by PCR POSITIVE (A) NEGATIVE Final    Comment:        The GeneXpert MRSA Assay (FDA approved  for NASAL specimens only), is one component of a comprehensive MRSA colonization surveillance program. It is not intended to diagnose MRSA infection nor to guide or monitor treatment for MRSA infections. RESULT CALLED TO, READ BACK BY AND VERIFIED WITH: LOFTIN,Q RN [id number] 03/04/18 MITCHELL,L        Radiology Studies: No results found.    Scheduled Meds: . Chlorhexidine Gluconate Cloth  6 each Topical Q0600  . [START ON 03/07/2018] levothyroxine  88 mcg Oral QAC breakfast  . metoprolol tartrate  2.5 mg Intravenous Q8H  . mupirocin ointment  1 application Nasal BID   Continuous Infusions: . acyclovir Stopped (03/06/18 0907)  . ampicillin (OMNIPEN) IV Stopped (03/06/18 0950)  . cefTRIAXone (ROCEPHIN)  IV Stopped (03/06/18 0233)  . vancomycin Stopped (03/06/18 0539)     LOS: 2 days    Time spent: 25 minutes   Dessa Phi, DO [facility] [email or web address] Password [email or web address] 03/06/2018, 11:30 AM

## 2018-03-07 LAB — GLUCOSE, CAPILLARY: Glucose-Capillary: 99 mg/dL (ref 65–99)

## 2018-03-07 LAB — CBC
HEMATOCRIT: 25.5 % — AB (ref 39.0–52.0)
HEMOGLOBIN: 8.8 g/dL — AB (ref 13.0–17.0)
MCH: 29.8 pg (ref 26.0–34.0)
MCHC: 34.5 g/dL (ref 30.0–36.0)
MCV: 86.4 fL (ref 78.0–100.0)
Platelets: 261 10*3/uL (ref 150–400)
RBC: 2.95 MIL/uL — ABNORMAL LOW (ref 4.22–5.81)
RDW: 13 % (ref 11.5–15.5)
WBC: 13.6 10*3/uL — ABNORMAL HIGH (ref 4.0–10.5)

## 2018-03-07 LAB — BASIC METABOLIC PANEL
Anion gap: 8 (ref 5–15)
BUN: 10 mg/dL (ref 6–20)
CHLORIDE: 103 mmol/L (ref 101–111)
CO2: 23 mmol/L (ref 22–32)
CREATININE: 0.74 mg/dL (ref 0.61–1.24)
Calcium: 8.1 mg/dL — ABNORMAL LOW (ref 8.9–10.3)
GFR calc Af Amer: >60 mL/min (ref >60)
GFR calc non Af Amer: >60 mL/min (ref >60)
Glucose, Bld: 103 mg/dL — ABNORMAL HIGH (ref 65–99)
Potassium: 3.3 mmol/L — ABNORMAL LOW (ref 3.5–5.1)
Sodium: 134 mmol/L — ABNORMAL LOW (ref 135–145)

## 2018-03-07 LAB — HERPES SIMPLEX VIRUS(HSV) DNA BY PCR
HSV 1 DNA: NEGATIVE
HSV 2 DNA: NEGATIVE

## 2018-03-07 MED ORDER — POTASSIUM CHLORIDE CRYS ER 20 MEQ PO TBCR
40.0000 meq | EXTENDED_RELEASE_TABLET | Freq: Once | ORAL | Status: AC
  Administered 2018-03-07: 40 meq via ORAL
  Filled 2018-03-07: qty 2

## 2018-03-07 MED ORDER — APIXABAN 5 MG PO TABS
5.0000 mg | ORAL_TABLET | Freq: Two times a day (BID) | ORAL | Status: DC
  Administered 2018-03-07: 5 mg via ORAL
  Filled 2018-03-07 (×2): qty 1

## 2018-03-07 NOTE — Progress Notes (Signed)
Patient will DC to: [facility] Anticipated DC date: 03/07/18 Family notified: Spouse at bedside Transport by: Spouse   Per MD patient ready for DC to [facility]. RN, patient, patient's family, and facility notified of DC. Discharge Summary and FL2 sent to facility, reviewed by Loa Socks. RN given number for report [phone]). DNR given to wife per facility request.    CSW signing off.  Cedric Fishman, LCSW Clinical Social Worker [phone]

## 2018-03-07 NOTE — Plan of Care (Signed)
  Problem: Education: Goal: Knowledge of General Education information will improve Outcome: Progressing   Problem: Clinical Measurements: Goal: Ability to maintain clinical measurements within normal limits will improve Outcome: Progressing Goal: Will remain free from infection Outcome: Progressing Goal: Diagnostic test results will improve Outcome: Progressing Goal: Respiratory complications will improve Outcome: Progressing

## 2018-03-07 NOTE — Progress Notes (Signed)
SATURATION QUALIFICATIONS: (This note is used to comply with regulatory documentation for home oxygen)  Patient Saturations on Room Air at Rest = 80%  Patient Saturations on Room Air while Ambulating = patient not ambulatory    Patient Saturations on 3 Liters of oxygen while lying in bed oxygen saturation 93%   Please briefly explain why patient needs home oxygen: patient quickly de-sats when taken off oxygen

## 2018-03-07 NOTE — Progress Notes (Signed)
CSW faxed over updated FL2 and DC summary with O2 orders. Patient is able to discharge once oxygen is delivered to [facility].   Percell Locus Coley Kulikowski LCSW [phone]

## 2018-03-07 NOTE — NC FL2 (Signed)
  [place] LEVEL OF CARE SCREENING TOOL     IDENTIFICATION  Patient Name: Zachary Vasquez Birthdate: 06/06/1941 Sex: male Admission Date (Current Location): 03/04/2018  [facility] and [place] Number:  Herbalist and Address:  The [place]. [facility], [place] [address] [place], [place] [address]      Provider Number: [id number]  Attending Physician Name and Address:  Dessa Phi, DO  Relative Name and Phone Number:       Current Level of Care: Hospital Recommended Level of Care: Memory Care Prior Approval Number:    Date Approved/Denied:   PASRR Number:    Discharge Plan: Other (Comment)(Memory Care)    Current Diagnoses: Patient Active Problem List   Diagnosis Date Noted  . Normocytic anemia 03/04/2018  . Essential hypertension 03/04/2018  . Hypothyroidism 03/04/2018  . Sepsis ([place]) 03/04/2018  . Acute metabolic encephalopathy 88/41/6606  . Fever   . HCAP (healthcare-associated pneumonia) 02/27/2018  . Dementia 02/27/2018  . PAF (paroxysmal atrial fibrillation) ([place]) 02/27/2018    Orientation RESPIRATION BLADDER Height & Weight     (Disoriented x4)  O2(Nasal cannula Continuous O2 3L) Incontinent Weight: 81.6 kg (180 lb) Height:  6' (182.9 cm)  BEHAVIORAL SYMPTOMS/MOOD NEUROLOGICAL BOWEL NUTRITION STATUS      Continent Diet(Regular)  AMBULATORY STATUS COMMUNICATION OF NEEDS Skin   Extensive Assist Verbally Normal                       Personal Care Assistance Level of Assistance  Bathing, Dressing Bathing Assistance: Maximum assistance   Dressing Assistance: Limited assistance     Functional Limitations Info             SPECIAL CARE FACTORS FREQUENCY                       Contractures      Additional Factors Info  Code Status, Allergies, Isolation Precautions Code Status Info: DNR Allergies Info: NKA     Isolation Precautions Info: Only a hospital protocol, treated with Bacrtroban cream for MRSA  in the nose     Current Medications (03/07/2018):   Discharge Medications: STOP taking these medications   potassium chloride 10 MEQ tablet Commonly known as:  K-DUR     TAKE these medications   acetaminophen 325 MG tablet Commonly known as:  TYLENOL Take 650 mg by mouth every 4 (four) hours as needed for mild pain.   ELIQUIS 5 MG Tabs tablet Generic drug:  apixaban Take 5 mg by mouth 2 (two) times daily.   furosemide 20 MG tablet Commonly known as:  LASIX Take 20 mg by mouth daily.   ipratropium-albuterol 0.5-2.5 (3) MG/3ML Soln Commonly known as:  DUONEB Take 3 mLs by nebulization every 6 (six) hours as needed (Shortness of Breath).   levothyroxine 88 MCG tablet Commonly known as:  SYNTHROID, LEVOTHROID Take 88 mcg by mouth daily before breakfast.      Relevant Imaging Results:  Relevant Lab Results:   Additional Information SSN: [place], [place]

## 2018-03-07 NOTE — Progress Notes (Signed)
Patient's wife refusing to sign O2 liability waiver therefore accepts risks of patient not receiving O2. [facility], [place], aware and can accept patient back. CSW faxing the [facility] signed at 12:06pm (without oxygen) and updated DC Summary to facility.   CSW signing off.  Percell Locus Onix Jumper LCSW [phone]

## 2018-03-07 NOTE — Clinical Social Work Note (Signed)
Clinical Social Work Assessment  Patient Details  Name: Zachary Vasquez MRN: [id number] Date of Birth: 1941-07-02  Date of referral:  03/07/18               Reason for consult:  Discharge Planning                Permission sought to share information with:  Family Supports, Customer service manager Permission granted to share information::  No  Name::     Zachary Vasquez  Agency::  [facility]  Relationship::  Spouse  Contact Information:     Housing/Transportation Living arrangements for the past 2 months:  [place] of Information:  Spouse Patient Interpreter Needed:  None Criminal Activity/Legal Involvement Pertinent to Current Situation/Hospitalization:  No - Comment as needed Significant Relationships:  Adult Children, Spouse Lives with:  Facility Resident Do you feel safe going back to the place where you live?  Yes Need for family participation in patient care:  Yes (Comment)  Care giving concerns:  CSW received consult regarding discharge planning. Patient's wife states that patient resides at [place] and will return there at discharge. CSW to continue to follow and assist with discharge planning needs.   Social Worker assessment / plan:  CSW spoke with patient's spouse regarding discharge plan back to ALF.  Employment status:  Retired Nurse, adult PT Recommendations:  Not assessed at this time Information / Referral to community resources:     Patient/Family's Response to care:  Patient's spouse reports agreement with discharge plan. She will be transporting patient by car.   Patient/Family's Understanding of and Emotional Response to Diagnosis, Current Treatment, and Prognosis:  Patient/family is realistic regarding therapy needs and expressed being hopeful for return to ALF placement. Patient's spouse expressed understanding of CSW role and discharge process as well as medical condition. No  questions/concerns about plan or treatment.    Emotional Assessment Appearance:  Appears stated age Attitude/Demeanor/Rapport:  Unable to Assess Affect (typically observed):  Unable to Assess Orientation:  (Disoriented x4) Alcohol / Substance use:  Not Applicable Psych involvement (Current and /or in the community):  No (Comment)  Discharge Needs  Concerns to be addressed:  Care Coordination Readmission within the last 30 days:  Yes Current discharge risk:  None Barriers to Discharge:  No Barriers Identified   Zachary Vasquez, [place] 03/07/2018, 11:35 AM

## 2018-03-07 NOTE — NC FL2 (Signed)
  [place] LEVEL OF CARE SCREENING TOOL     IDENTIFICATION  Patient Name: Zachary Vasquez Birthdate: Nov 06, 1941 Sex: male Admission Date (Current Location): 03/04/2018  [facility] and [place] Number:  Herbalist and Address:  [name]. [facility], [place] [address], [place], [place] [address]      Provider Number: [id number]  Attending Physician Name and Address:  Dessa Phi, DO  Relative Name and Phone Number:       Current Level of Care: Hospital Recommended Level of Care: [place] Prior Approval Number:    Date Approved/Denied:   PASRR Number:    Discharge Plan: Other (Comment)(ALF)    Current Diagnoses: Patient Active Problem List   Diagnosis Date Noted  . Normocytic anemia 03/04/2018  . Essential hypertension 03/04/2018  . Hypothyroidism 03/04/2018  . Sepsis ([place]) 03/04/2018  . Acute metabolic encephalopathy 84/16/6063  . Fever   . HCAP (healthcare-associated pneumonia) 02/27/2018  . Dementia 02/27/2018  . PAF (paroxysmal atrial fibrillation) ([place]) 02/27/2018    Orientation RESPIRATION BLADDER Height & Weight     (Disoriented x4)  Normal Incontinent Weight: 81.6 kg (180 lb) Height:  6' (182.9 cm)  BEHAVIORAL SYMPTOMS/MOOD NEUROLOGICAL BOWEL NUTRITION STATUS      Continent Diet(Regular)  AMBULATORY STATUS COMMUNICATION OF NEEDS Skin   Extensive Assist Verbally Normal                       Personal Care Assistance Level of Assistance  Bathing, Dressing Bathing Assistance: Maximum assistance   Dressing Assistance: Limited assistance     Functional Limitations Info             SPECIAL CARE FACTORS FREQUENCY                       Contractures      Additional Factors Info  Code Status, Allergies, Isolation Precautions Code Status Info: DNR Allergies Info: NKA     Isolation Precautions Info: Only a hospital protocol, treated with Bacrtroban cream for MRSA in the nose      Current Medications (03/07/2018):    Discharge Medications: STOP taking these medications   potassium chloride 10 MEQ tablet Commonly known as:  K-DUR     TAKE these medications   acetaminophen 325 MG tablet Commonly known as:  TYLENOL Take 650 mg by mouth every 4 (four) hours as needed for mild pain.   ELIQUIS 5 MG Tabs tablet Generic drug:  apixaban Take 5 mg by mouth 2 (two) times daily.   furosemide 20 MG tablet Commonly known as:  LASIX Take 20 mg by mouth daily.   ipratropium-albuterol 0.5-2.5 (3) MG/3ML Soln Commonly known as:  DUONEB Take 3 mLs by nebulization every 6 (six) hours as needed (Shortness of Breath).   levothyroxine 88 MCG tablet Commonly known as:  SYNTHROID, LEVOTHROID Take 88 mcg by mouth daily before breakfast.       Relevant Imaging Results:  Relevant Lab Results:   Additional Information SSN: [place], [place]

## 2018-03-07 NOTE — NC FL2 (Signed)
  [place] LEVEL OF CARE SCREENING TOOL     IDENTIFICATION  Patient Name: Zachary Vasquez Birthdate: Nov 13, 1941 Sex: male Admission Date (Current Location): 03/04/2018  [facility] and [place] Number:  Herbalist and Address:  [facility] [place]. [facility], [place] [address] [place], [place] [address]      Provider Number: [id number]  Attending Physician Name and Address:  Dessa Phi, DO  Relative Name and Phone Number:       Current Level of Care: Hospital Recommended Level of Care: Memory Care Prior Approval Number:    Date Approved/Denied:   PASRR Number:    Discharge Plan: Other (Comment)(Memory Care)    Current Diagnoses: Patient Active Problem List   Diagnosis Date Noted  . Normocytic anemia 03/04/2018  . Essential hypertension 03/04/2018  . Hypothyroidism 03/04/2018  . Sepsis ([place]) 03/04/2018  . Acute metabolic encephalopathy 46/80/3212  . Fever   . HCAP (healthcare-associated pneumonia) 02/27/2018  . Dementia 02/27/2018  . PAF (paroxysmal atrial fibrillation) ([place]) 02/27/2018    Orientation RESPIRATION BLADDER Height & Weight     (Disoriented x4)  Normal Incontinent Weight: 81.6 kg (180 lb) Height:  6' (182.9 cm)  BEHAVIORAL SYMPTOMS/MOOD NEUROLOGICAL BOWEL NUTRITION STATUS      Continent Diet(Regular)  AMBULATORY STATUS COMMUNICATION OF NEEDS Skin   Extensive Assist Verbally Normal                       Personal Care Assistance Level of Assistance  Bathing, Dressing Bathing Assistance: Maximum assistance   Dressing Assistance: Limited assistance     Functional Limitations Info             SPECIAL CARE FACTORS FREQUENCY                       Contractures      Additional Factors Info  Code Status, Allergies, Isolation Precautions Code Status Info: DNR Allergies Info: NKA     Isolation Precautions Info: Only a hospital protocol, treated with Bacrtroban cream for MRSA in the nose     Current  Medications (03/07/2018):     Discharge Medications: STOP taking these medications   potassium chloride 10 MEQ tablet Commonly known as:  K-DUR     TAKE these medications   acetaminophen 325 MG tablet Commonly known as:  TYLENOL Take 650 mg by mouth every 4 (four) hours as needed for mild pain.   ELIQUIS 5 MG Tabs tablet Generic drug:  apixaban Take 5 mg by mouth 2 (two) times daily.   furosemide 20 MG tablet Commonly known as:  LASIX Take 20 mg by mouth daily.   ipratropium-albuterol 0.5-2.5 (3) MG/3ML Soln Commonly known as:  DUONEB Take 3 mLs by nebulization every 6 (six) hours as needed (Shortness of Breath).   levothyroxine 88 MCG tablet Commonly known as:  SYNTHROID, LEVOTHROID Take 88 mcg by mouth daily before breakfast.      Relevant Imaging Results:  Relevant Lab Results:   Additional Information SSN: [place], [place]

## 2018-03-07 NOTE — Progress Notes (Signed)
NCM made referral with [facility] for home oxygen. [facility]. Address: [address] , [place], [place] [address].  Whitman Hero CM,BSN,CM

## 2018-03-07 NOTE — Discharge Summary (Addendum)
Physician Discharge Summary  Zachary Vasquez BZJ:[id number] DOB: 12-31-1940 DOA: 03/04/2018  PCP: [name], MD  Admit date: 03/04/2018 Discharge date: 03/07/2018  Admitted From: Memory care Disposition: Memory care  Recommendations for Outpatient Follow-up:  1. Follow up with PCP in 1 week 2. Repeat BMP and CBC in 1 week to repeat potassium level (hypokalemia replaced prior to discharge) and leukocytosis (improving during hospitalization)  3. Please follow up on the following pending results: CSF culture result (no organisms seen on day of discharge), CSF fungal culture result, CSF HSV result, final blood culture result (negative at day of discharge) 4. Continue to wean [place] O2 as able. Attempted to set up home O2 for discharge home. Wife has declined use of home oxygen and out of pocket fees. She accepts risks of not discharging with [place] O2.   Discharge Condition: Stable CODE STATUS: DNR  Diet recommendation: Regular diet   Brief/Interim Summary: Zachary Vasquez a 77 y.o.malewith medical history significant ofhypertension, hyperlipidemia, atrial fibrillation on Eliquis, SSS, AICD placement, dementia(per wife, at baseline patient is confused regarding his place/location and is not very talkative, but is able to feed himself), prostate cancer, hypothyroidism, recent admission due to HCAP, who presents with altered mental status and fever. Patient was recently hospitalized from 04/16-04/18 due to HCAP and AMS. Pt was treated with IV antibiotics, initially with vancomycin and cefepime, then switched to Zosyn due to concerning for aspiration pneumonia. Pt was discharged on Augmentin for 5 days. Per his son, pt has improved initially, buton 4/20pt becamemore confused than his baseline. At baseline, pt is not able to communicate due to dementia, but is more alert per his son. Patient also found to have fever and leukocytosis and was admitted with concern for meningitis as no other  source of infectious process found. He was started on empiric vancomycin, ampicillin, rocephin, acyclovir for coverage of meningitis. Eliquis was held > 48 hours and he underwent LP under fluoroscopy. CSF was grossly unremarkable with clear appearance, glucose 58, protein 39, WBC 0, with no organism seen on smear. Blood cultures remained negative, urine culture negative, CXR with improvement in right lower lobe consolidation since his previous hospitalization. He remained afebrile and mentation returned back to his baseline state.   Discharge Diagnoses:  Principal Problem:   Sepsis ([place]) Active Problems:   Dementia   PAF (paroxysmal atrial fibrillation) (HCC)   Normocytic anemia   Essential hypertension   Hypothyroidism   Acute metabolic encephalopathy   Acute toxic encephalopathy -Initial concern for sepsis and/or meningitis causing altered mentation -CT head negative for acute change -Patient much improved; he is alert to voice. Does not follow commands. Ate meals yesterday without issue. Per wife, he is back to baseline with patient's underlying dementia   SIRS only  Initial suspicion for meningitis, ruled out  -CXR and UA are negative for infectious process, influenza negative  -Blood culture negative to date  -Unfortunately, last Eliquis dose was 4/20 8pm and unable to complete LP at time of admission. He was started on broad spectrum antibiotics, antiviral vanco/rocephin/ampicilin/acyclovir. -LP 4/23: CSF was grossly unremarkable with clear appearance, glucose 58, protein 39, WBC 0, with no organism seen on smear. Pending CSF culture result (no organisms seen on day of discharge), CSF fungal culture result, CSF HSV result -No source of infection found; no skin wounds/ulcers, diarrhea, worsening respiratory illness, urinary source, procalcitonin < 0.10  -Stop antibiotic/antiviral and monitor -Has been afebrile last 24 hours with improving WBC   A Fib -Resume  eliquis  Hypothyroidism -Continue synthroid  Hypokalemia -Replaced   Acute hypoxemic respiratory failure due to chronic emphysema POA -CXR showed emphysematous disease -Patient was recently admitted for HCAP and was given antibiotic course. He has also received broad spectrum antibiotics during his hospitalization such as IV vancomycin, IV rocephin -Attempted to set up home O2 for discharge home. Wife has declined use of home oxygen and out of pocket fees. She accepts risks of not discharging with [place] O2.   Discharge Instructions  Discharge Instructions    Call MD for:  difficulty breathing, headache or visual disturbances   Complete by:  As directed    Call MD for:  extreme fatigue   Complete by:  As directed    Call MD for:  hives   Complete by:  As directed    Call MD for:  persistant dizziness or light-headedness   Complete by:  As directed    Call MD for:  persistant nausea and vomiting   Complete by:  As directed    Call MD for:  severe uncontrolled pain   Complete by:  As directed    Call MD for:  temperature >100.4   Complete by:  As directed    Discharge instructions   Complete by:  As directed    You were cared for by a hospitalist during your hospital stay. If you have any questions about your discharge medications or the care you received while you were in the hospital after you are discharged, you can call the unit and asked to speak with the hospitalist on call if the hospitalist that took care of you is not available. Once you are discharged, your primary care physician will handle any further medical issues. Please note that NO REFILLS for any discharge medications will be authorized once you are discharged, as it is imperative that you return to your primary care physician (or establish a relationship with a primary care physician if you do not have one) for your aftercare needs so that they can reassess your need for medications and monitor your lab values.    Increase activity slowly   Complete by:  As directed      Allergies as of 03/07/2018   No Known Allergies     Medication List    STOP taking these medications   potassium chloride 10 MEQ tablet Commonly known as:  K-DUR     TAKE these medications   acetaminophen 325 MG tablet Commonly known as:  TYLENOL Take 650 mg by mouth every 4 (four) hours as needed for mild pain.   ELIQUIS 5 MG Tabs tablet Generic drug:  apixaban Take 5 mg by mouth 2 (two) times daily.   furosemide 20 MG tablet Commonly known as:  LASIX Take 20 mg by mouth daily.   ipratropium-albuterol 0.5-2.5 (3) MG/3ML Soln Commonly known as:  DUONEB Take 3 mLs by nebulization every 6 (six) hours as needed (Shortness of Breath).   levothyroxine 88 MCG tablet Commonly known as:  SYNTHROID, LEVOTHROID Take 88 mcg by mouth daily before breakfast.      Follow-up Information    [name], MD. Schedule an appointment as soon as possible for a visit in 1 week(s).   Specialty:  Internal Medicine Contact information: [place]. [place] [address] [phone]          No Known Allergies  Consultations:  None   Procedures/Studies: Dg Chest 1 View  Result Date: 03/04/2018 CLINICAL DATA:  Altered mental status EXAM:  CHEST  1 VIEW COMPARISON:  02/27/2018 FINDINGS: Left-sided pacing device as before. Hyperinflation with emphysematous disease. Possible small right pleural effusion. Minimal atelectasis or infiltrate at the right base. Stable cardiomediastinal silhouette with aortic atherosclerosis. IMPRESSION: 1. Possible tiny right effusion with hazy atelectasis or minimal infiltrate at the right base. 2. Emphysematous disease Electronically Signed   By: Donavan Foil M.D.   On: 03/04/2018 00:51   Ct Head Wo Contrast  Result Date: 03/04/2018 CLINICAL DATA:  Altered mental status. Febrile. History of dementia. EXAM: CT HEAD WITHOUT CONTRAST TECHNIQUE: Contiguous axial images were  obtained from the base of the skull through the vertex without intravenous contrast. COMPARISON:  02/27/2018 FINDINGS: Brain: There is no evidence of acute large territory infarct, intracranial hemorrhage, mass, midline shift, or extra-axial fluid collection. 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 Patchy to confluent hypodensities throughout the cerebral white matter bilaterally are unchanged and nonspecific but compatible with moderate to severe chronic small vessel ischemic disease. There is moderate cerebral atrophy. Vascular: Calcified atherosclerosis at the skull base. No hyperdense vessel. Skull: No fracture or focal osseous lesion. Sinuses/Orbits: Visualized paranasal sinuses and mastoid air cells are clear. Postoperative changes to the globes. Other: None. IMPRESSION: 1. No evidence of acute intracranial abnormality. 2. Moderate to severe chronic small vessel ischemic disease and moderate cerebral atrophy. Electronically Signed   By: Logan Bores M.D.   On: 03/04/2018 07:14   Ct Head Wo Contrast  Result Date: 02/27/2018 CLINICAL DATA:  Altered level of consciousness. History of dementia, hypertension and atrial fibrillation. EXAM: CT HEAD WITHOUT CONTRAST TECHNIQUE: Contiguous axial images were obtained from the base of the skull through the vertex without intravenous contrast. COMPARISON:  None. FINDINGS: Mild motion degraded examination. BRAIN: No intraparenchymal hemorrhage, mass effect nor midline shift. Moderate parenchymal brain volume loss. No hydrocephalus. Confluent supratentorial white matter hypodensities. 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 No acute large vascular territory infarcts. No abnormal extra-axial fluid collections. Basal cisterns are patent. VASCULAR: Mild calcific atherosclerosis of the carotid siphons. SKULL: No skull fracture. Torus palatini. No significant scalp soft tissue swelling. SINUSES/ORBITS: The mastoid air-cells and included  paranasal sinuses are well-aerated. Soft tissue LEFT external auditory canal most compatible with cerumen. Status post RIGHT ocular globe silicon injection and scleral banding. Status post LEFT ocular lens implant. OTHER: None. IMPRESSION: 1. Motion degraded examination. 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 2. Moderate parenchymal brain volume loss and moderate to severe chronic small vessel ischemic disease. Electronically Signed   By: Elon Alas M.D.   On: 02/27/2018 04:19   Dg Chest Portable 1 View  Result Date: 02/27/2018 CLINICAL DATA:  Fever for 2 days. EXAM: PORTABLE CHEST 1 VIEW COMPARISON:  None. FINDINGS: RIGHT lower lobe airspace opacity. Small RIGHT pleural effusion. Cardiomediastinal silhouette is normal given rotation to the RIGHT, calcified aortic arch. No pneumothorax. Dual lead LEFT cardiac pacemaker with lead tips projecting RIGHT atrium and RIGHT ventricle. No pneumothorax. Osteopenia. IMPRESSION: RIGHT lower lobe airspace opacity concerning for pneumonia with small RIGHT pleural effusion. Followup PA and lateral chest X-ray is recommended in 3-4 weeks following trial of antibiotic therapy to ensure resolution and exclude underlying malignancy. Aortic Atherosclerosis (ICD10-I70.0). Electronically Signed   By: Elon Alas M.D.   On: 02/27/2018 01:50   Dg Fluoro Guide Lumbar Puncture  Result Date: 03/06/2018 CLINICAL DATA:  Mental status changes.  Fever.  Possible meningitis. EXAM: DIAGNOSTIC LUMBAR PUNCTURE UNDER FLUOROSCOPIC GUIDANCE FLUOROSCOPY TIME:  Fluoroscopy Time:  1 minutes and 0  seconds. Radiation Exposure Index (if provided by the fluoroscopic device): 8.0 mGy Number of Acquired Spot Images: 0 PROCEDURE: Informed consent was obtained from the patients wife via telephone conversation prior to the procedure, including potential complications of headache, allergy, bleeding and infection. With the patient prone, the lower back was prepped with  Betadine. 1% Lidocaine was used for local anesthesia. Lumbar puncture was performed at the L4-5 level using a 20 gauge needle with return of clear CSF. 10.5 ml of CSF were obtained for laboratory studies. The patient tolerated the procedure well and there were no apparent complications. IMPRESSION: Fluoroscopic guided lumbar puncture with 10.5 cc of clear CSF obtained for appropriate laboratory evaluation. Electronically Signed   By: Marijo Sanes M.D.   On: 03/06/2018 16:04       Discharge Exam: Vitals:   03/06/18 2056 03/07/18 0508  BP: (!) 151/85 (!) 147/91  Pulse: 91 72  Resp: 18 18  Temp: 98.4 F (36.9 C) 98.2 F (36.8 C)  SpO2: 94% 95%    General: Pt is alert, awake, not in acute distress Cardiovascular: Irreg rhythm rate 80s, S1/S2 +, no rubs, no gallops Respiratory: CTA bilaterally, no wheezing, no rhonchi Abdominal: Soft, NT, ND, bowel sounds + Extremities: no edema, no cyanosis Neuro: Alert but does not interact    The results of significant diagnostics from this hospitalization (including imaging, microbiology, ancillary and laboratory) are listed below for reference.     Microbiology: Recent Results (from the past 240 hour(s))  Blood culture (routine x 2)     Status: None   Collection Time: 02/27/18  1:30 AM  Result Value Ref Range Status   Specimen Description BLOOD RIGHT ARM  Final   Special Requests   Final    BOTTLES DRAWN AEROBIC AND ANAEROBIC Blood Culture results may not be optimal due to an excessive volume of blood received in culture bottles   Culture   Final    NO GROWTH 5 DAYS Performed at [facility], [place] [address] [place], [place] [address]    Report Status 03/04/2018 FINAL  Final  Blood culture (routine x 2)     Status: None   Collection Time: 02/27/18  1:45 AM  Result Value Ref Range Status   Specimen Description BLOOD LEFT HAND  Final   Special Requests   Final    BOTTLES DRAWN AEROBIC AND ANAEROBIC Blood Culture adequate volume    Culture   Final    NO GROWTH 5 DAYS Performed at [facility], [place] [address] [place], [place] [address]    Report Status 03/04/2018 FINAL  Final  Urine culture     Status: None   Collection Time: 02/27/18  2:03 AM  Result Value Ref Range Status   Specimen Description URINE, RANDOM  Final   Special Requests NONE  Final   Culture   Final    NO GROWTH Performed at [facility], [place] [address] [place], [place] [address]    Report Status 02/28/2018 FINAL  Final  Urine Culture     Status: None   Collection Time: 03/04/18  1:55 AM  Result Value Ref Range Status   Specimen Description URINE, RANDOM  Final   Special Requests NONE  Final   Culture   Final    NO GROWTH Performed at [facility], [place] [address], [place],  [address]    Report Status 03/05/2018 FINAL  Final  Culture, blood (Routine X 2) w Reflex to  ID Panel     Status: None (Preliminary result)   Collection Time: 03/04/18  4:05 AM  Result Value Ref Range Status   Specimen Description BLOOD RIGHT HAND  Final   Special Requests   Final    BOTTLES DRAWN AEROBIC AND ANAEROBIC Blood Culture adequate volume   Culture   Final    NO GROWTH 2 DAYS Performed at [facility], [address], [place], [place] [address]    Report Status PENDING  Incomplete  Culture, blood (Routine X 2) w Reflex to ID Panel     Status: None (Preliminary result)   Collection Time: 03/04/18  4:15 AM  Result Value Ref Range Status   Specimen Description BLOOD LEFT HAND  Final   Special Requests   Final    BOTTLES DRAWN AEROBIC AND ANAEROBIC Blood Culture results may not be optimal due to an inadequate volume of blood received in culture bottles   Culture   Final    NO GROWTH 2 DAYS Performed at [facility], [place] [address] [place], [place] [address]    Report Status PENDING  Incomplete  MRSA PCR Screening     Status: Abnormal   Collection Time: 03/04/18  8:32 PM  Result Value Ref Range Status   MRSA by PCR  POSITIVE (A) NEGATIVE Final    Comment:        The GeneXpert MRSA Assay (FDA approved for NASAL specimens only), is one component of a comprehensive MRSA colonization surveillance program. It is not intended to diagnose MRSA infection nor to guide or monitor treatment for MRSA infections. RESULT CALLED TO, READ BACK BY AND VERIFIED WITH: LOFTIN,Q RN [id number] 03/04/18 MITCHELL,L   CSF culture     Status: None (Preliminary result)   Collection Time: 03/06/18  3:37 PM  Result Value Ref Range Status   Specimen Description CSF  Final   Special Requests NONE  Final   Gram Stain   Final    WBC PRESENT, PREDOMINANTLY MONONUCLEAR NO ORGANISMS SEEN CYTOSPIN SMEAR Performed at [facility], [place] [address] [place], [place] [address]    Culture PENDING  Incomplete   Report Status PENDING  Incomplete     Labs: BNP (last 3 results) Recent Labs    02/27/18 0314  BNP 00.8   Basic Metabolic Panel: Recent Labs  Lab 03/01/18 0855 03/04/18 0017 03/04/18 0524 03/05/18 0538 03/06/18 0450 03/07/18 0341  NA 135 138 138 136 136 134*  K 2.9* 3.9 3.9 3.6 3.4* 3.3*  CL 102 107 107 107 107 103  CO2 21* 20* 20* 23 21* 23  GLUCOSE 150* 126* 126* 118* 110* 103*  BUN 8 23* 22* 19 16 10   CREATININE 0.91 1.02 0.97 0.73 0.81 0.74  CALCIUM 8.7* 8.7* 8.7* 8.5* 8.3* 8.1*  MG 1.8  --   --   --   --   --    Liver Function Tests: Recent Labs  Lab 03/04/18 0017  AST 44*  ALT 24  ALKPHOS 52  BILITOT 0.8  PROT 6.1*  ALBUMIN 2.8*   No results for input(s): LIPASE, AMYLASE in the last 168 hours. No results for input(s): AMMONIA in the last 168 hours. CBC: Recent Labs  Lab 03/04/18 0017 03/04/18 0524 03/05/18 0538 03/06/18 0450 03/07/18 0341  WBC 14.6* 15.3* 15.0* 13.8* 13.6*  HGB 9.9* 9.4* 8.7* 9.1* 8.8*  HCT 29.1* 27.2* 26.1* 27.1* 25.5*  MCV 87.4 88.0 88.2 88.6 86.4  PLT 225 231 234 261  261   Cardiac Enzymes: No results for input(s): CKTOTAL, CKMB, CKMBINDEX, TROPONINI in the  last 168 hours. BNP: Invalid input(s): POCBNP CBG: Recent Labs  Lab 03/04/18 0942 03/06/18 0824 03/07/18 0757  GLUCAP 136* 105* 99   D-Dimer No results for input(s): DDIMER in the last 72 hours. Hgb A1c No results for input(s): HGBA1C in the last 72 hours. Lipid Profile No results for input(s): CHOL, HDL, LDLCALC, TRIG, CHOLHDL, LDLDIRECT in the last 72 hours. Thyroid function studies No results for input(s): TSH, T4TOTAL, T3FREE, THYROIDAB in the last 72 hours.  Invalid input(s): FREET3 Anemia work up No results for input(s): VITAMINB12, FOLATE, FERRITIN, TIBC, IRON, RETICCTPCT in the last 72 hours. Urinalysis    Component Value Date/Time   COLORURINE YELLOW 03/04/2018 0011   APPEARANCEUR HAZY (A) 03/04/2018 0011   LABSPEC 1.025 03/04/2018 0011   PHURINE 5.0 03/04/2018 0011   GLUCOSEU NEGATIVE 03/04/2018 0011   HGBUR LARGE (A) 03/04/2018 0011   BILIRUBINUR NEGATIVE 03/04/2018 0011   KETONESUR 5 (A) 03/04/2018 0011   PROTEINUR 30 (A) 03/04/2018 0011   NITRITE NEGATIVE 03/04/2018 0011   LEUKOCYTESUR NEGATIVE 03/04/2018 0011   Sepsis Labs Invalid input(s): PROCALCITONIN,  WBC,  LACTICIDVEN Microbiology Recent Results (from the past 240 hour(s))  Blood culture (routine x 2)     Status: None   Collection Time: 02/27/18  1:30 AM  Result Value Ref Range Status   Specimen Description BLOOD RIGHT ARM  Final   Special Requests   Final    BOTTLES DRAWN AEROBIC AND ANAEROBIC Blood Culture results may not be optimal due to an excessive volume of blood received in culture bottles   Culture   Final    NO GROWTH 5 DAYS Performed at [facility], [place] [address], [place], [place] [address]    Report Status 03/04/2018 FINAL  Final  Blood culture (routine x 2)     Status: None   Collection Time: 02/27/18  1:45 AM  Result Value Ref Range Status   Specimen Description BLOOD LEFT HAND  Final   Special Requests   Final    BOTTLES DRAWN AEROBIC AND ANAEROBIC Blood Culture  adequate volume   Culture   Final    NO GROWTH 5 DAYS Performed at [facility], [place] [address] [place], [place] [address]    Report Status 03/04/2018 FINAL  Final  Urine culture     Status: None   Collection Time: 02/27/18  2:03 AM  Result Value Ref Range Status   Specimen Description URINE, RANDOM  Final   Special Requests NONE  Final   Culture   Final    NO GROWTH Performed at [facility], [place] [address], [place], [place] [address]    Report Status 02/28/2018 FINAL  Final  Urine Culture     Status: None   Collection Time: 03/04/18  1:55 AM  Result Value Ref Range Status   Specimen Description URINE, RANDOM  Final   Special Requests NONE  Final   Culture   Final    NO GROWTH Performed at [facility], [place] [address] [place], [place] [address]    Report Status 03/05/2018 FINAL  Final  Culture, blood (Routine X 2) w Reflex to ID Panel     Status: None (Preliminary result)   Collection Time: 03/04/18  4:05 AM  Result Value Ref Range Status   Specimen Description BLOOD RIGHT HAND  Final   Special Requests   Final  BOTTLES DRAWN AEROBIC AND ANAEROBIC Blood Culture adequate volume   Culture   Final    NO GROWTH 2 DAYS Performed at [facility], [place] [address] [place], [place] [address]    Report Status PENDING  Incomplete  Culture, blood (Routine X 2) w Reflex to ID Panel     Status: None (Preliminary result)   Collection Time: 03/04/18  4:15 AM  Result Value Ref Range Status   Specimen Description BLOOD LEFT HAND  Final   Special Requests   Final    BOTTLES DRAWN AEROBIC AND ANAEROBIC Blood Culture results may not be optimal due to an inadequate volume of blood received in culture bottles   Culture   Final    NO GROWTH 2 DAYS Performed at [facility], [place] [address], [place], [place] [address]    Report Status PENDING  Incomplete  MRSA PCR Screening     Status: Abnormal   Collection Time: 03/04/18  8:32 PM  Result Value Ref Range  Status   MRSA by PCR POSITIVE (A) NEGATIVE Final    Comment:        The GeneXpert MRSA Assay (FDA approved for NASAL specimens only), is one component of a comprehensive MRSA colonization surveillance program. It is not intended to diagnose MRSA infection nor to guide or monitor treatment for MRSA infections. RESULT CALLED TO, READ BACK BY AND VERIFIED WITH: LOFTIN,Q RN 2426 03/04/18 MITCHELL,L   CSF culture     Status: None (Preliminary result)   Collection Time: 03/06/18  3:37 PM  Result Value Ref Range Status   Specimen Description CSF  Final   Special Requests NONE  Final   Gram Stain   Final    WBC PRESENT, PREDOMINANTLY MONONUCLEAR NO ORGANISMS SEEN CYTOSPIN SMEAR Performed at [facility], [place] [address], [place], [place] [address]    Culture PENDING  Incomplete   Report Status PENDING  Incomplete     Patient was seen and examined on the day of discharge and was found to be in stable condition. Time coordinating discharge: 35 minutes including assessment and coordination of care, as well as examination of the patient.   SIGNED:  Dessa Phi, DO [facility] Pager [phone]  If 7PM-7AM, please contact night-coverage [email or web address] Password [email or web address] 03/07/2018, 9:05 AM

## 2018-03-07 NOTE — Progress Notes (Signed)
[facility] made NCM aware pt's wife refuses to sign waiver of liability for DME home oxygen. NCM spoke with wife to confirm decision and wife stated she did not want pt to receive oxygen and " yes , I refused to sign waiver of liability for home oxygen."  Whitman Hero RN,BSN,CM

## 2018-03-09 LAB — CULTURE, BLOOD (ROUTINE X 2)
CULTURE: NO GROWTH
Culture: NO GROWTH
Special Requests: ADEQUATE

## 2018-03-09 LAB — CSF CULTURE W GRAM STAIN: Culture: NO GROWTH

## 2018-03-09 LAB — CSF CULTURE

## 2018-03-12 ENCOUNTER — Emergency Department (HOSPITAL_COMMUNITY): Payer: Medicare Other

## 2018-03-12 ENCOUNTER — Other Ambulatory Visit: Payer: Self-pay

## 2018-03-12 ENCOUNTER — Emergency Department (HOSPITAL_COMMUNITY)
Admission: EM | Admit: 2018-03-12 | Discharge: 2018-03-13 | Disposition: A | Discharge status: Home / Self Care | Payer: Medicare Other | Attending: Emergency Medicine | Admitting: Emergency Medicine

## 2018-03-12 ENCOUNTER — Encounter (HOSPITAL_COMMUNITY): Payer: Self-pay

## 2018-03-12 DIAGNOSIS — S72141A Displaced intertrochanteric fracture of right femur, initial encounter for closed fracture: Secondary | ICD-10-CM | POA: Diagnosis not present

## 2018-03-12 DIAGNOSIS — Z85828 Personal history of other malignant neoplasm of skin: Secondary | ICD-10-CM | POA: Insufficient documentation

## 2018-03-12 DIAGNOSIS — Z7901 Long term (current) use of anticoagulants: Secondary | ICD-10-CM | POA: Diagnosis not present

## 2018-03-12 DIAGNOSIS — F0390 Unspecified dementia without behavioral disturbance: Secondary | ICD-10-CM | POA: Diagnosis not present

## 2018-03-12 DIAGNOSIS — S79911A Unspecified injury of right hip, initial encounter: Secondary | ICD-10-CM | POA: Diagnosis present

## 2018-03-12 DIAGNOSIS — Z87891 Personal history of nicotine dependence: Secondary | ICD-10-CM | POA: Insufficient documentation

## 2018-03-12 DIAGNOSIS — Y998 Other external cause status: Secondary | ICD-10-CM | POA: Insufficient documentation

## 2018-03-12 DIAGNOSIS — X58XXXA Exposure to other specified factors, initial encounter: Secondary | ICD-10-CM | POA: Diagnosis not present

## 2018-03-12 DIAGNOSIS — R52 Pain, unspecified: Secondary | ICD-10-CM

## 2018-03-12 DIAGNOSIS — Z950 Presence of cardiac pacemaker: Secondary | ICD-10-CM | POA: Insufficient documentation

## 2018-03-12 DIAGNOSIS — Y939 Activity, unspecified: Secondary | ICD-10-CM | POA: Diagnosis not present

## 2018-03-12 DIAGNOSIS — Z79899 Other long term (current) drug therapy: Secondary | ICD-10-CM | POA: Insufficient documentation

## 2018-03-12 DIAGNOSIS — Y929 Unspecified place or not applicable: Secondary | ICD-10-CM | POA: Insufficient documentation

## 2018-03-12 DIAGNOSIS — I10 Essential (primary) hypertension: Secondary | ICD-10-CM | POA: Diagnosis not present

## 2018-03-12 DIAGNOSIS — T148XXA Other injury of unspecified body region, initial encounter: Secondary | ICD-10-CM

## 2018-03-12 LAB — CBC WITH DIFFERENTIAL/PLATELET
Basophils Absolute: 0 10*3/uL (ref 0.0–0.1)
Basophils Relative: 0 %
Eosinophils Absolute: 0 10*3/uL (ref 0.0–0.7)
Eosinophils Relative: 0 %
HCT: 27.5 % — ABNORMAL LOW (ref 39.0–52.0)
HEMOGLOBIN: 9.3 g/dL — AB (ref 13.0–17.0)
LYMPHS PCT: 7 %
Lymphs Abs: 1.1 10*3/uL (ref 0.7–4.0)
MCH: 29.7 pg (ref 26.0–34.0)
MCHC: 33.8 g/dL (ref 30.0–36.0)
MCV: 87.9 fL (ref 78.0–100.0)
Monocytes Absolute: 1.5 10*3/uL — ABNORMAL HIGH (ref 0.1–1.0)
Monocytes Relative: 9 %
NEUTROS ABS: 13.9 10*3/uL — AB (ref 1.7–7.7)
NEUTROS PCT: 84 %
Platelets: 410 10*3/uL — ABNORMAL HIGH (ref 150–400)
RBC: 3.13 MIL/uL — AB (ref 4.22–5.81)
RDW: 13.9 % (ref 11.5–15.5)
WBC: 16.6 10*3/uL — ABNORMAL HIGH (ref 4.0–10.5)

## 2018-03-12 LAB — BASIC METABOLIC PANEL
Anion gap: 11 (ref 5–15)
BUN: 12 mg/dL (ref 6–20)
CHLORIDE: 95 mmol/L — AB (ref 101–111)
CO2: 25 mmol/L (ref 22–32)
Calcium: 8.1 mg/dL — ABNORMAL LOW (ref 8.9–10.3)
Creatinine, Ser: 0.94 mg/dL (ref 0.61–1.24)
GFR calc Af Amer: >60 mL/min (ref >60)
GFR calc non Af Amer: >60 mL/min (ref >60)
GLUCOSE: 114 mg/dL — AB (ref 65–99)
POTASSIUM: 3.4 mmol/L — AB (ref 3.5–5.1)
SODIUM: 131 mmol/L — AB (ref 135–145)

## 2018-03-12 MED ORDER — LEVOTHYROXINE SODIUM 88 MCG PO TABS
88.0000 ug | ORAL_TABLET | Freq: Every day | ORAL | Status: DC
  Administered 2018-03-13: 88 ug via ORAL
  Filled 2018-03-12: qty 1

## 2018-03-12 MED ORDER — IPRATROPIUM-ALBUTEROL 0.5-2.5 (3) MG/3ML IN SOLN: 3.0000 mL | Freq: Four times a day (QID) | RESPIRATORY_TRACT | Status: DC | PRN

## 2018-03-12 MED ORDER — FUROSEMIDE 20 MG PO TABS
20.0000 mg | ORAL_TABLET | Freq: Every day | ORAL | Status: DC
  Administered 2018-03-12 – 2018-03-13 (×2): 20 mg via ORAL
  Filled 2018-03-12 (×2): qty 1

## 2018-03-12 MED ORDER — ACETAMINOPHEN 325 MG PO TABS: 650.0000 mg | ORAL_TABLET | ORAL | Status: DC | PRN
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
## 2018-03-12 NOTE — ED Triage Notes (Signed)
Pt in from [facility] via [facility] with c/o R hip pain with confirmed femur fx. Staff denies any observed trauma/falls. Take Eliquis, a&ox3 baseline, has dementia. Good PMS in RLE

## 2018-03-12 NOTE — Discharge Instructions (Addendum)
Please focus on pain control for this patient.  Patient is to be nonambulatory and should have 24-hour assistance with transfers.  Family has requested nonsurgical treatment.

## 2018-03-12 NOTE — ED Notes (Signed)
Condom cath applied to [facility]

## 2018-03-12 NOTE — Progress Notes (Addendum)
CSW consulted for community based hospice referral. CSW spoke with pt's son, Nicki Reaper, at [phone]. Scott explained that he has been working with [facility] to start pt on community based hospice. Pt was supposed to be evaluated by hospice today, but had to come to the ED. Evaluation should be next week. [facility] with a hospice agency. Pt's son, Nicki Reaper, gave verbal permission for CSW to reach out to [facility] [facility] abd community hospice provider. CSW called [facility] to ensure a referral is made to the hospice representative. CSW awaiting call back with hospice representative contact information for [facility].   Update: Pt's son informed CSW that [facility] called him and stated that pt will need a hospital bed to go back to facility. CSW spoke with [place] confirmed that pt needs a hospital bed. CSW spoke with Loa Socks with [facility] at [phone], Loa Socks stated pt needs hospital bed and script for pain medication to return.   Pt is being followed by [facility]. [facility] went to admit him today. [facility] stated they are working on possibly getting him a hospital bed tonight and admitted into hospice tonight. CSW spoke with Benjamine Mola at [phone].   Update: Hospice is unable to admit pt tonight. Pt will not be able to return to [facility] until a hospital bed is delivered. Hospice will provide bed once admitted to Hospice. Hospice can evaluate pt first thing tomorrow morning and provide hospital bed. Evaluation can happen at [facility]. CSW will leave handoff for daytime CM and CSW. CSW updated EDP and pt's son, Nicki Reaper.   CSW initially spoke with Bambi with [facility]. CSW called and updated Bambi that pt is working with [facility].   Wendelyn Breslow, [facility]  [phone]

## 2018-03-12 NOTE — Progress Notes (Signed)
Patient with right intertrochanteric hip fracture.  Also has advanced dementia, non-ambulator, nonverbal according to family.  The family clearly says they do not want surgery, almost under any circumstances, and wish for Palliative Care.  I have discussed this with them over the phone, and would agree that palliative medicine is worth investigating.  Plan for medical admission, Palliative Care care consultation,  I'm available if surgery is desired.  Marchia Bond, MD

## 2018-03-12 NOTE — ED Provider Notes (Signed)
I saw and evaluated the patient, reviewed the resident's note and I agree with the findings and plan.  Pertinent History: 77 year old male, chronically debilitated, recent admission to the hospital for pneumonia, since that time has been in a nursing facility where he was found to have some deformity of his hip with pain today.  Imaging obtained at the nursing facility showed a fracture of the right hip and he was sent to the emergency department.  The patient has known dementia, level 5 caveat applies, on exam the patient is sleeping, he is arousable, he has bilateral lower extremity had pitting edema which is symmetrical and pain with range of motion of the right hip.  The son has asked if there are any nonsurgical options as he has witnessed his father's decline and feels that there is very little functional outcome benefit.  Will discuss with orthopedics.  Patient is anticoagulated.  Pre op CXR clear. Hold anticoagulants  Final diagnoses:  Closed displaced intertrochanteric fracture of right femur, initial encounter ([place])      Noemi Chapel, MD 03/19/18 1506

## 2018-03-12 NOTE — ED Notes (Signed)
Patient transported to CT 

## 2018-03-13 ENCOUNTER — Other Ambulatory Visit: Payer: Self-pay

## 2018-03-13 NOTE — ED Notes (Signed)
Son also visiting w/pt. Son and spouse aware of tx plan - Hospice RN will be coming to speak w/them this am and when hospital bed arrives to [facility], pt will be transported back there. Family in agreement w/tx plan.

## 2018-03-13 NOTE — ED Notes (Signed)
NT assisting pt w/eating breakfast.

## 2018-03-13 NOTE — ED Notes (Signed)
Pt noted w/discoloration to right thigh.

## 2018-03-13 NOTE — ED Notes (Signed)
Spouse in wpt - assisted pt w/eating lunch.

## 2018-03-13 NOTE — Progress Notes (Addendum)
9:21am- CSW received call from [place] [phone] informing CSW that she has ordered bed and is waiting for bed to be delivered to facility at this time. Jenn expressed that she would call CSW back once the bed had been delivered to [facility]. Danise Mina also informed CSW that she would send a RN liaison over to the ED to speak with family, however assessment would be completed at [facility] once pt returned. CSW has updated RN and pt's wife at bedside at this time. CSW awaiting phone call for conformation on bed being at facility at this time.   CSW received call from [facility] with [facility] and was informed that she is working on getting hospital bed to facility at this time. CSW was informed that Danise Mina would call CSW back once bed has been delivered to facility. Jenn expressed that she was under the impression that pt would be being assessed at [facility] by and not the ED(Note states this). CSW expressed that CSW would follow up with Loa Socks from [facility] and gather more information.   CSW spoke with Loa Socks and was informed that he spoke with [facility] with [facility] and was told that pt would be being assessed in the ED by hospice and that is why he asked that pt's family be here at the ED this morning. Loa Socks to call CSW back to informed CSW of plan at this time.     Zachary Vasquez, MSW, [facility] Clinical Social Worker [phone]

## 2018-03-13 NOTE — ED Notes (Signed)
Hospice RN in w/pt and family.

## 2018-03-13 NOTE — Progress Notes (Addendum)
7:42am- CSW reached back out to [place] with [facility] to follow up with who would be coming to assess pt today. CSW was informed by Benjamine Mola that she is no longer on call and that CSW would have to call the Business office at 8:30am to see who would be coming out. CSW will call business office at 8:30am to gather more information on bed status and pt being on hospice.   CSW spoke with Loa Socks from [facility] [employer] and was informed that [facility] would be out to assess pt this morning at 9:30am. CSW sought further information on when pt could return to the facility and Loa Socks expressed that they need the hospital bed before pt can return. Loa Socks verbalized that Hospice would work on getting the hospital bed but if pt was to return before hospice got the bed then Loa Socks would need assistance from [facility] on getting this bed. CSW to follow up with Loa Socks once [facility] has met with pt this morning for next steps in getting pt back to [facility].    Virgie Dad Yaa Donnellan, MSW, [place] [facility] Clinical Social Worker [phone]

## 2018-03-13 NOTE — ED Notes (Addendum)
DNR form given to [facility] along w/d/c paperwork and [facility] transfer paperwork.

## 2018-03-13 NOTE — ED Notes (Signed)
Pt's spouse taking all of pt's belongings. Pt to be transported back to [facility] via [place] in hospital gown.

## 2018-03-13 NOTE — ED Notes (Signed)
Pt has O2 infusing via n/c at 3L/min.

## 2018-03-13 NOTE — Progress Notes (Addendum)
3:05pm- CSW has faxed over information to [facility] at this time. There are no further CSW needs. CSW will sign off.   2:11pm-CSW informed that bed is at [facility] at this time. CSW has arranged transport to take pt back to facility. RN to call report to [phone]. CSW to fax over AVS to [phone].   CSW spoke with Amy from [facility] and was informed that bed is 20 minutes away. CSW asked that Amy or someone from hospice or [facility] call CSW to confirm that bed is there when it arrives. At this time CSW still awaiting call for confirmation on bed in order to set up transport. CSW has updated RN and spouse at bedside.   Virgie Dad Daviyon Widmayer, MSW, [place] [facility] Clinical Social Worker [phone]

## 2018-03-13 NOTE — ED Notes (Signed)
Spouse arrived to bedside.

## 2018-04-05 LAB — FUNGAL ORGANISM REFLEX

## 2018-04-05 LAB — FUNGUS CULTURE WITH STAIN

## 2018-04-05 LAB — FUNGUS CULTURE RESULT

## 2018-04-14 DEATH — deceased

## 2019-05-16 IMAGING — CT CT HEAD W/O CM
4 series · 16 of 47 positions shown, 18 images · non-contrast
Comparison: None.

CLINICAL DATA: Altered level of consciousness. History of dementia,
hypertension and atrial fibrillation.

EXAM:
CT HEAD WITHOUT CONTRAST
TECHNIQUE: Contiguous axial images were obtained from the base of the skull
through the vertex without intravenous contrast.

[Series 3: head without · axial · non-contrast · 0.42mm/px · z∈[-82,+58]mm · 7 of 38 slices shown, 9 images]
[im 5/38  brain]
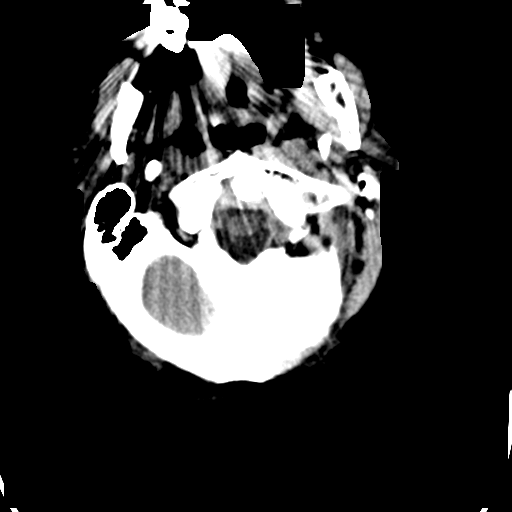
[im 5/38  bone]
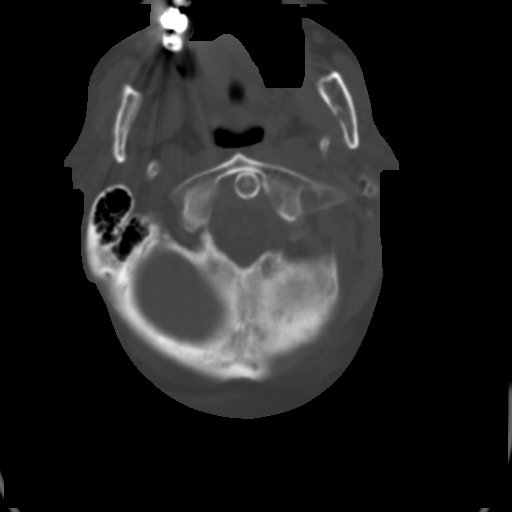
[im 10/38  brain]
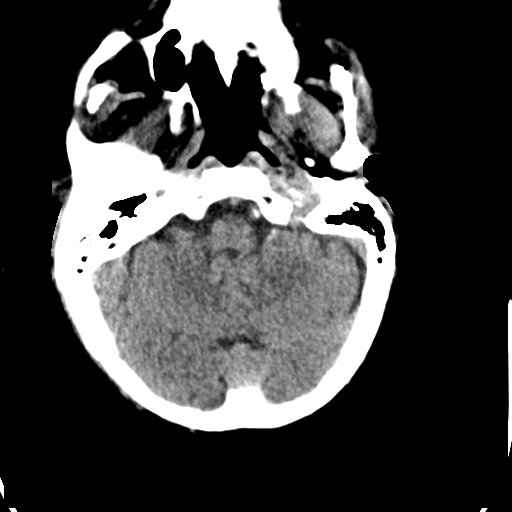
[im 14/38  brain]
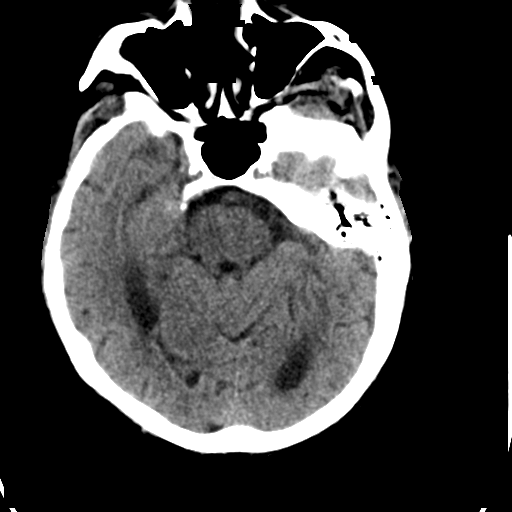
[im 19/38  brain]
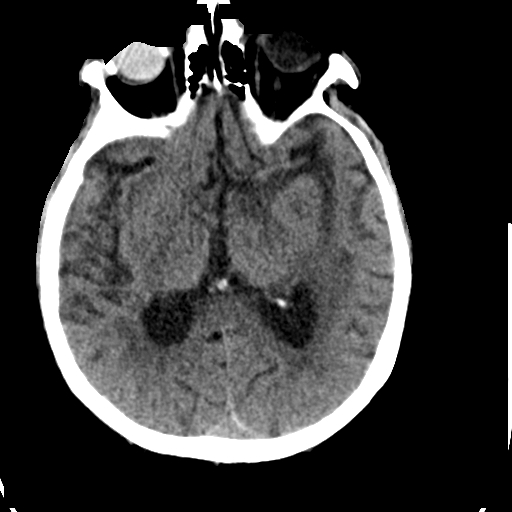
[im 24/38  brain]
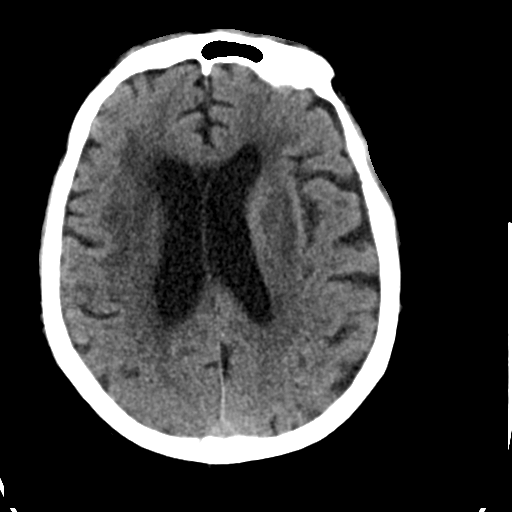
[im 24/38  bone]
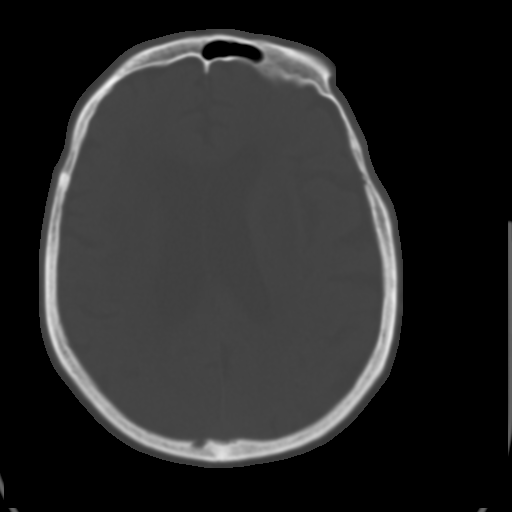
[im 28/38  brain]
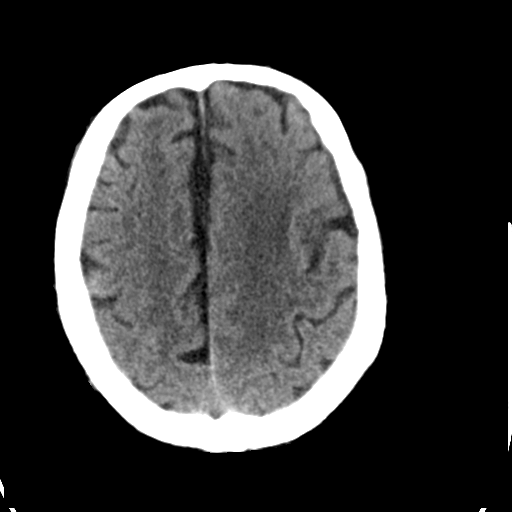
[im 33/38  brain]
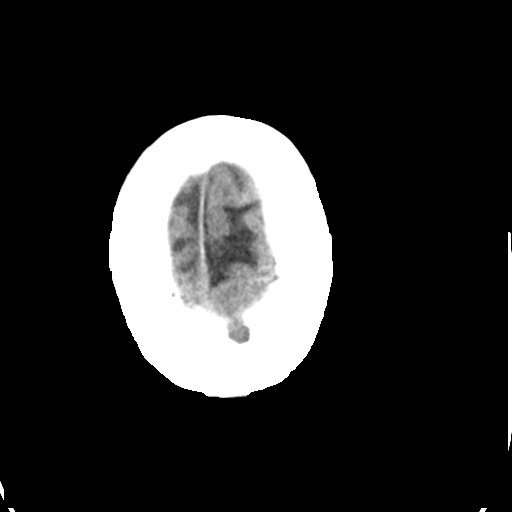

[Series 4: head bone · axial · 0.42mm/px · z∈[-84,-46]mm · 3 of 95 slices shown]
[im 10/95  bone]
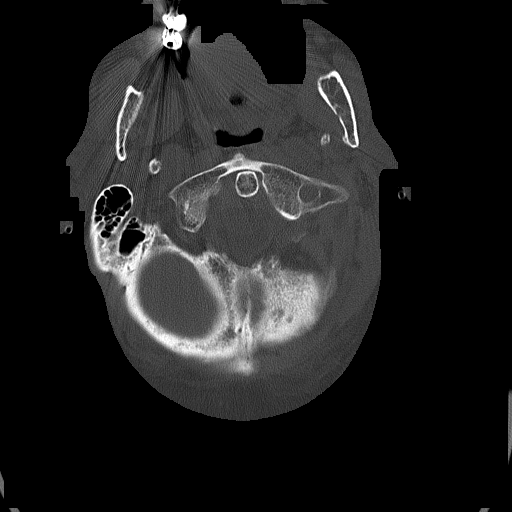
[im 19/95  bone]
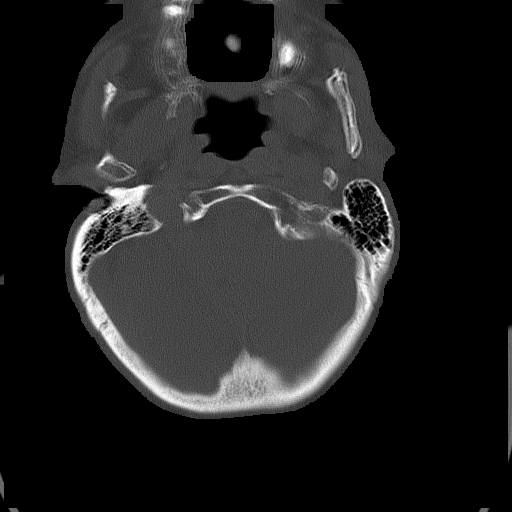
[im 29/95  bone]
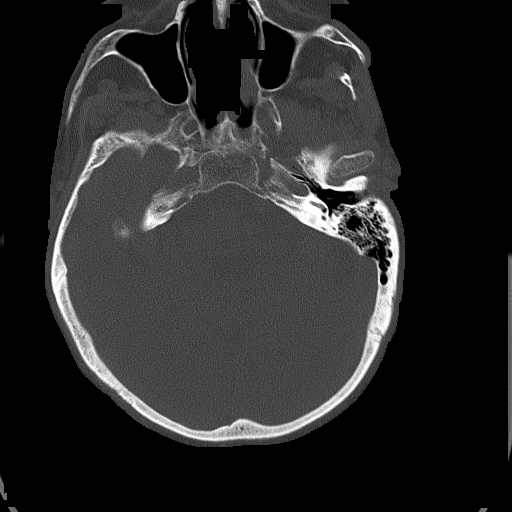

[Series 5: head without cor · coronal · non-contrast · 0.38mm/px · 3 of 73 slices shown]
[im 25/73  brain]
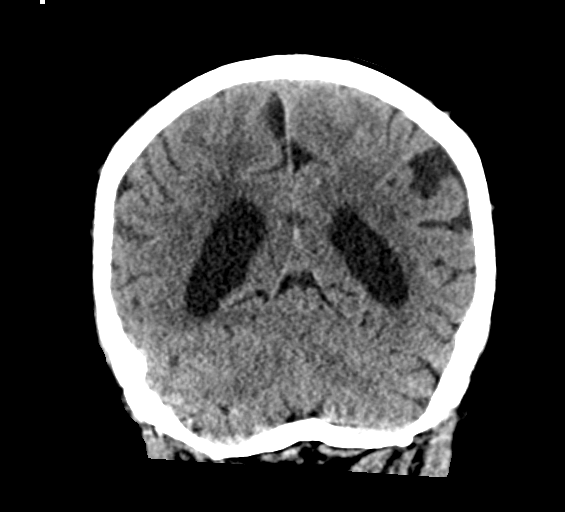
[im 33/73  brain]
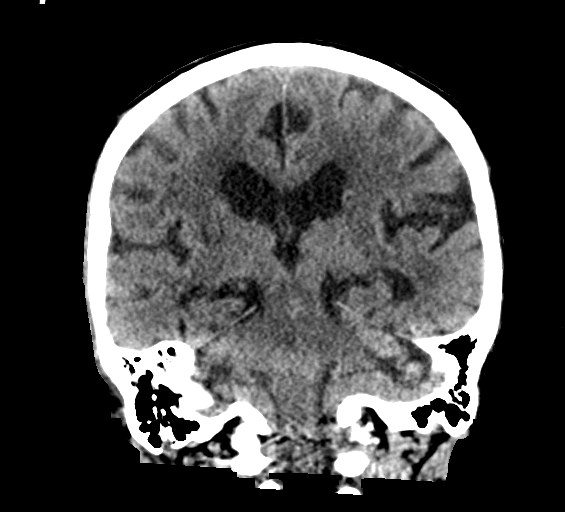
[im 41/73  brain]
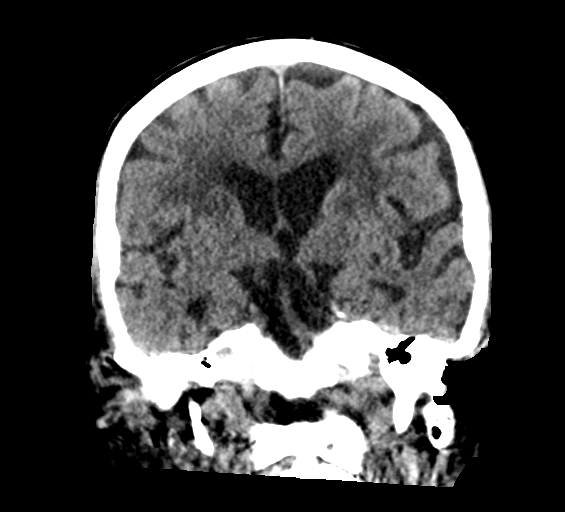

[Series 6: head without sag · sagittal · non-contrast · 0.36mm/px · 3 of 65 slices shown]
[im 22/65  brain]
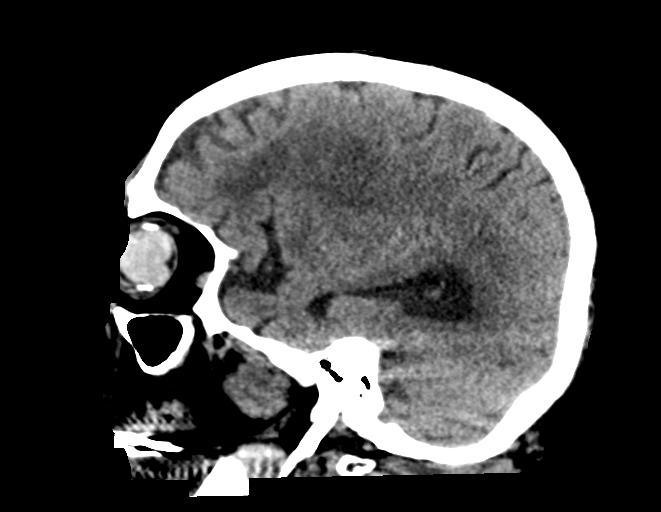
[im 33/65  brain]
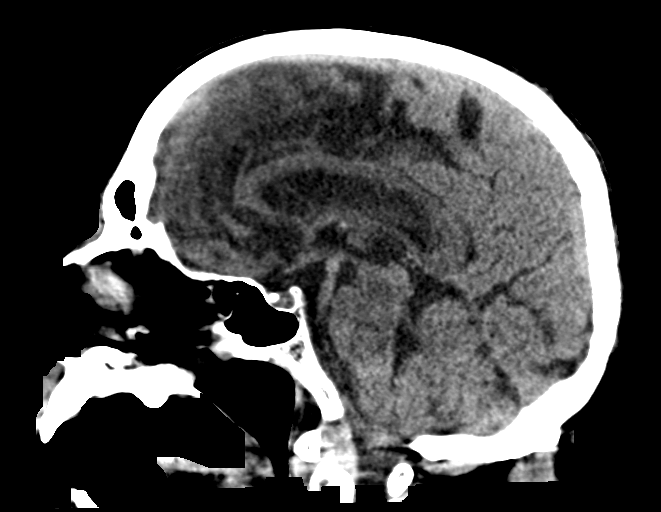
[im 43/65  brain]
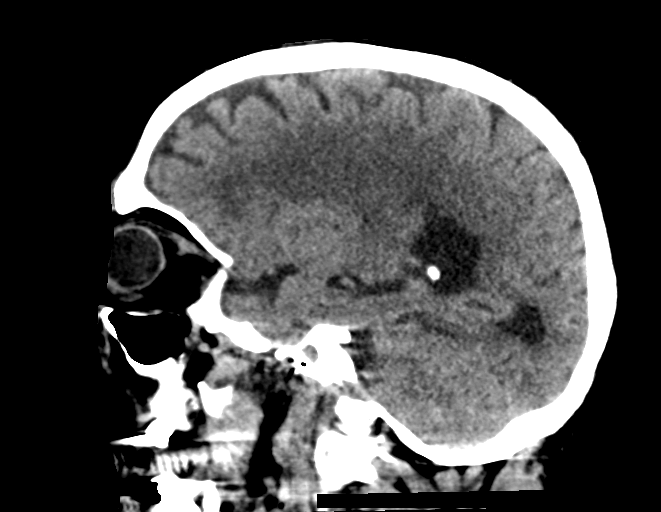

[16 of 47 positions shown; findings below may reference images not displayed]

FINDINGS: Mild motion degraded examination.

BRAIN: No intraparenchymal hemorrhage, mass effect nor midline
shift. Moderate parenchymal brain volume loss. No hydrocephalus.
Confluent supratentorial white matter hypodensities. Age
indeterminate bilateral basal ganglia lacunar infarcts. No acute
large vascular territory infarcts. No abnormal extra-axial fluid
collections. Basal cisterns are patent.

VASCULAR: Mild calcific atherosclerosis of the carotid siphons.

SKULL: No skull fracture. Torus palatini. No significant scalp soft
tissue swelling.

SINUSES/ORBITS: The mastoid air-cells and included paranasal sinuses
are well-aerated. Soft tissue LEFT external auditory canal most
compatible with cerumen. Status post RIGHT ocular globe silicon
injection and scleral banding. Status post LEFT ocular lens implant.

OTHER: None.
IMPRESSION: 1. Motion degraded examination. Age indeterminate bilateral basal
ganglia lacunar infarcts.
2. Moderate parenchymal brain volume loss and moderate to severe
chronic small vessel ischemic disease.

## 2019-05-16 IMAGING — DX DG CHEST 1V PORT
1 series · 1 of 1 positions shown · non-contrast
Comparison: None.

CLINICAL DATA: Fever for 2 days.

EXAM:
PORTABLE CHEST 1 VIEW

[chest]
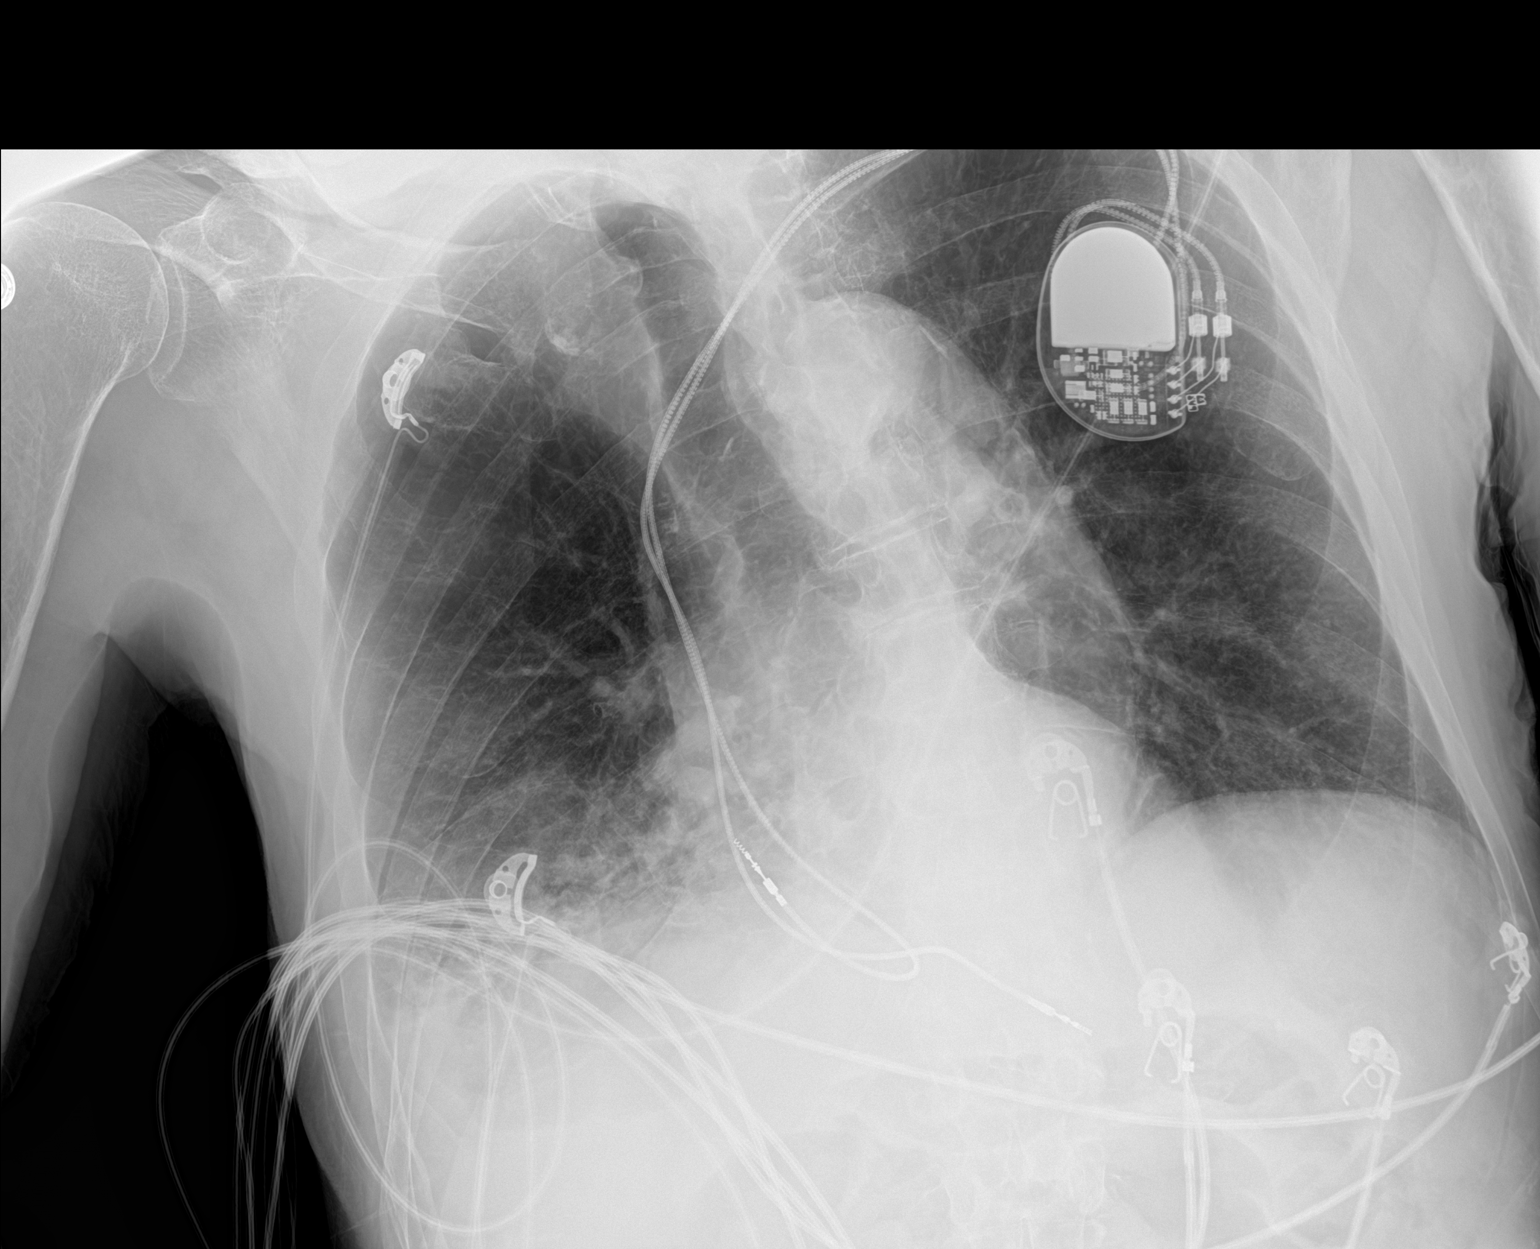

[1 of 1 positions shown; findings below may reference images not displayed]

FINDINGS: RIGHT lower lobe airspace opacity. Small RIGHT pleural effusion.
Cardiomediastinal silhouette is normal given rotation to the RIGHT,
calcified aortic arch. No pneumothorax. Dual lead LEFT cardiac
pacemaker with lead tips projecting RIGHT atrium and RIGHT
ventricle. No pneumothorax. Osteopenia.
IMPRESSION: RIGHT lower lobe airspace opacity concerning for pneumonia with
small RIGHT pleural effusion. Followup PA and lateral chest X-ray is
recommended in 3-4 weeks following trial of antibiotic therapy to
ensure resolution and exclude underlying malignancy.

Aortic Atherosclerosis ([id number]).
